# Patient Record
Sex: Male | Born: 1980 | Race: White | Hispanic: No | Smoking: Current some day smoker
Health system: Southern US, Community
[De-identification: ages and names within clinical notes are randomized; demographics above are authoritative.]

## PROBLEM LIST (undated history)

## (undated) DIAGNOSIS — M1990 Unspecified osteoarthritis, unspecified site: Secondary | ICD-10-CM

## (undated) DIAGNOSIS — R202 Paresthesia of skin: Secondary | ICD-10-CM

## (undated) DIAGNOSIS — R51 Headache: Secondary | ICD-10-CM

## (undated) DIAGNOSIS — R519 Headache, unspecified: Secondary | ICD-10-CM

---

## 2000-10-05 ENCOUNTER — Emergency Department (HOSPITAL_COMMUNITY): Admission: EM | Admit: 2000-10-05 | Discharge: 2000-10-05 | Payer: Self-pay | Admitting: Emergency Medicine

## 2011-09-23 ENCOUNTER — Encounter: Payer: Self-pay | Admitting: Internal Medicine

## 2011-09-23 ENCOUNTER — Ambulatory Visit (INDEPENDENT_AMBULATORY_CARE_PROVIDER_SITE_OTHER): Payer: Self-pay | Admitting: Internal Medicine

## 2011-09-23 DIAGNOSIS — F411 Generalized anxiety disorder: Secondary | ICD-10-CM

## 2011-09-23 DIAGNOSIS — F17200 Nicotine dependence, unspecified, uncomplicated: Secondary | ICD-10-CM

## 2011-09-23 DIAGNOSIS — N529 Male erectile dysfunction, unspecified: Secondary | ICD-10-CM

## 2011-09-23 DIAGNOSIS — F419 Anxiety disorder, unspecified: Secondary | ICD-10-CM

## 2011-09-23 MED ORDER — VARDENAFIL HCL 10 MG PO TABS: ORAL_TABLET | ORAL | Status: DC

## 2011-09-23 NOTE — Assessment & Plan Note (Signed)
Counseled regarding the need for cessation. Continue weaning attempts with ultimate cessation.

## 2011-09-23 NOTE — Assessment & Plan Note (Signed)
Obtain tsh (also interested at future lab visit to obtain basic screening labs such as chem7 and lipid). Avoid potentially habit forming medications. Discussed possible low dose ssri but currently defers.

## 2011-09-23 NOTE — Progress Notes (Signed)
  Subjective:    Patient ID: Scott Christian, male    DOB: 08-09-1981, 30 y.o.   MRN: [id number]  HPI Pt presents to clinic to establish care and for evaluation of multiple medical problems. Recent dental work (pulled teeth) with more planned and is taking pcn and vicodin currently. Notes several month h/o ED described as difficulty sustaining an erection. Has intact libido. From discussion there may be a performance anxiety component. Does has increased stress and anxiety without depressive sx's. Also notes decreased concentration without known h/o ADD. Wishes to avoid addictive medications. Currently smoking less than one ppd of cigarettes and is attempting to wean and cease. Declines flu vaccine.   Reviewed pmh, psh, medications, allergies, soc hx and fam hx.   Review of Systems  Psychiatric/Behavioral: Positive for decreased concentration. Negative for dysphoric mood and agitation. The patient is nervous/anxious.   All other systems reviewed and are negative.       Objective:   Physical Exam  Nursing note and vitals reviewed. Constitutional: He appears well-developed and well-nourished. No distress.  HENT:  Head: Normocephalic and atraumatic.  Right Ear: External ear normal.  Left Ear: External ear normal.  Eyes: Conjunctivae are normal. Right eye exhibits no discharge. Left eye exhibits no discharge. No scleral icterus.  Neck: Neck supple.  Cardiovascular: Normal rate, regular rhythm and normal heart sounds.  Exam reveals no gallop and no friction rub.   No murmur heard. Pulmonary/Chest: Effort normal and breath sounds normal. No respiratory distress. He has no wheezes. He has no rales.  Neurological: He is alert.  Skin: Skin is warm and dry. He is not diaphoretic. No erythema.  Psychiatric: He has a normal mood and affect. His speech is normal and behavior is normal. Thought content normal.          Assessment & Plan:

## 2011-09-23 NOTE — Assessment & Plan Note (Signed)
Obtain testosterone level (will call back regarding when available to perform labs). Attempt short term levitra prn. Suspect possible non organic component

## 2011-10-18 ENCOUNTER — Emergency Department (HOSPITAL_COMMUNITY)
Admission: EM | Admit: 2011-10-18 | Discharge: 2011-10-18 | Disposition: A | Discharge status: Home / Self Care | Payer: Self-pay | Attending: Emergency Medicine | Admitting: Emergency Medicine

## 2011-10-18 ENCOUNTER — Encounter (HOSPITAL_COMMUNITY): Payer: Self-pay | Admitting: *Deleted

## 2011-10-18 DIAGNOSIS — X500XXA Overexertion from strenuous movement or load, initial encounter: Secondary | ICD-10-CM | POA: Insufficient documentation

## 2011-10-18 DIAGNOSIS — M25519 Pain in unspecified shoulder: Secondary | ICD-10-CM | POA: Insufficient documentation

## 2011-10-18 DIAGNOSIS — F17200 Nicotine dependence, unspecified, uncomplicated: Secondary | ICD-10-CM | POA: Insufficient documentation

## 2011-10-18 DIAGNOSIS — IMO0001 Reserved for inherently not codable concepts without codable children: Secondary | ICD-10-CM | POA: Insufficient documentation

## 2011-10-18 DIAGNOSIS — S46919A Strain of unspecified muscle, fascia and tendon at shoulder and upper arm level, unspecified arm, initial encounter: Secondary | ICD-10-CM

## 2011-10-18 MED ORDER — DICLOFENAC SODIUM 75 MG PO TBEC: 75.0000 mg | DELAYED_RELEASE_TABLET | Freq: Two times a day (BID) | ORAL | Status: DC

## 2011-10-18 MED ORDER — CYCLOBENZAPRINE HCL 10 MG PO TABS: 10.0000 mg | ORAL_TABLET | Freq: Three times a day (TID) | ORAL | Status: AC | PRN

## 2011-10-18 MED ORDER — TRAMADOL HCL 50 MG PO TABS: 50.0000 mg | ORAL_TABLET | Freq: Four times a day (QID) | ORAL | Status: AC | PRN

## 2011-10-18 MED ORDER — KETOROLAC TROMETHAMINE 60 MG/2ML IM SOLN
60.0000 mg | Freq: Once | INTRAMUSCULAR | Status: AC
  Administered 2011-10-18: 60 mg via INTRAMUSCULAR
  Filled 2011-10-18: qty 2

## 2011-10-18 NOTE — ED Notes (Signed)
C/o left shoulder pain x 1 week, one week ago pt was swinging a heavy tool to dig holes in ground, c/o burning pain

## 2011-10-18 NOTE — ED Notes (Signed)
Pt a/ox4. Resp even and unlabored. NAD at this time. NAD at this time. D/C instructions reviewed with pt. Pt verbalized understanding. Pt ambulated to lobby with steady gate.

## 2011-10-18 NOTE — ED Provider Notes (Signed)
History     CSN: [id number] Arrival date & time: 10/18/2011  8:45 PM   First MD Initiated Contact with Patient 10/18/11 2056      Chief Complaint  Patient presents with  . Shoulder Pain    (Consider location/radiation/quality/duration/timing/severity/associated sxs/prior treatment) HPI Comments: Patient c/o pain left shoulder pain for greater than one week.  Pain began after he was "swinging a maddox" all day to dig holes at work.  Pain is worse with movement, improves slightly with rest.  He denies neck pain or stiffness, numbness or weakness  Patient is a 30 y.o. male presenting with shoulder pain. The history is provided by the patient.  Shoulder Pain The current episode started in the past 7 days. The problem occurs constantly. The problem has been unchanged. Associated symptoms include arthralgias and myalgias. Pertinent negatives include no abdominal pain, chills, fatigue, fever, headaches, joint swelling, neck pain, numbness, rash, sore throat or weakness. He has tried NSAIDs, heat and ice for the symptoms. The treatment provided no relief.    Past Medical History  Diagnosis Date  . History of chicken pox     Past Surgical History  Procedure Date  . No past surgeries 09/23/2011    denies surgical history    Family History  Problem Relation Age of Onset  . Uterine cancer Mother   . Diabetes Neg Hx   . Breast cancer Maternal Grandmother   . Hyperlipidemia Father   . Hyperlipidemia Paternal Grandfather   . Stroke Paternal Grandfather   . Hypertension Father   . Hypertension Paternal Grandfather     History  Substance Use Topics  . Smoking status: Current Everyday Smoker -- 0.5 packs/day    Types: Cigarettes  . Smokeless tobacco: Never Used   Comment: 1/2 ppd  . Alcohol Use: No      Review of Systems  Constitutional: Negative for fever, chills and fatigue.  HENT: Negative for sore throat, trouble swallowing, neck pain and neck stiffness.     Gastrointestinal: Negative for abdominal pain.  Genitourinary: Negative for dysuria, hematuria and flank pain.  Musculoskeletal: Positive for myalgias and arthralgias. Negative for back pain, joint swelling and gait problem.  Skin: Negative.  Negative for rash.  Neurological: Negative for dizziness, weakness, numbness and headaches.  Hematological: Does not bruise/bleed easily.    Allergies  Nickel  Home Medications   Current Outpatient Rx  Name Route Sig Dispense Refill  . HYDROCODONE-ACETAMINOPHEN 7.5-500 MG PO TABS Oral Take 1 tablet by mouth every 6 (six) hours as needed.      Marland Kitchen PENICILLIN V POTASSIUM 250 MG PO TABS Oral Take 500 mg by mouth every 4 (four) hours.      Marland Kitchen VARDENAFIL HCL 10 MG PO TABS  One by mouth 30-45 mins before intercourse daily as needed 6 tablet 1    BP 143/83  Pulse 66  Temp(Src) 97.5 F (36.4 C) (Oral)  Resp 20  Ht 6' (1.829 m)  Wt 167 lb (75.751 kg)  BMI 22.65 kg/m2  SpO2 100%  Physical Exam  Nursing note and vitals reviewed. Constitutional: He is oriented to person, place, and time. He appears well-developed and well-nourished. No distress.  HENT:  Head: Normocephalic and atraumatic.  Mouth/Throat: Oropharynx is clear and moist.  Neck: Normal range of motion. Neck supple.  Cardiovascular: Normal rate, regular rhythm and normal heart sounds.   Pulmonary/Chest: Effort normal and breath sounds normal. No respiratory distress. He exhibits no tenderness.  Musculoskeletal: Normal range of motion. He exhibits  tenderness. He exhibits no edema.       Left shoulder: He exhibits tenderness and pain. He exhibits normal range of motion, no bony tenderness, no swelling, no effusion, no crepitus, normal pulse and normal strength.       Arms: Lymphadenopathy:    He has no cervical adenopathy.  Neurological: He is alert and oriented to person, place, and time. No cranial nerve deficit or sensory deficit. He exhibits normal muscle tone. Coordination normal.   Reflex Scores:      Tricep reflexes are 2+ on the right side and 2+ on the left side.      Bicep reflexes are 2+ on the right side and 2+ on the left side. Skin: Skin is warm and dry.    ED Course  Procedures (including critical care time)       MDM    9:14 PM ttp of the left anterior shoulder and along the border of the left scapula.  Pt has full ROM of the shoulder joint, distal sensation intact, CR< 2 sec, radial pulse is brisk.  Likely muscular strain   Pt agrees to f/u with ortho if needed       Corran Lalone L. [place], [place] 10/18/11 2128

## 2011-10-18 NOTE — ED Provider Notes (Signed)
Medical screening examination/treatment/procedure(s) were performed by non-physician practitioner and as supervising physician I was immediately available for consultation/collaboration.   Juliet Rude. Rubin Payor, MD 10/18/11 2141

## 2011-11-04 ENCOUNTER — Ambulatory Visit: Payer: Self-pay | Admitting: Internal Medicine

## 2012-06-22 ENCOUNTER — Ambulatory Visit (INDEPENDENT_AMBULATORY_CARE_PROVIDER_SITE_OTHER): Payer: BC Managed Care – PPO | Admitting: Internal Medicine

## 2012-06-22 VITALS — BP 122/66 | HR 60 | Temp 98.0°F | Resp 16 | Ht 71.0 in | Wt 156.8 lb

## 2012-06-22 DIAGNOSIS — H5710 Ocular pain, unspecified eye: Secondary | ICD-10-CM

## 2012-06-22 DIAGNOSIS — H5712 Ocular pain, left eye: Secondary | ICD-10-CM

## 2012-06-22 DIAGNOSIS — H16009 Unspecified corneal ulcer, unspecified eye: Secondary | ICD-10-CM

## 2012-06-22 DIAGNOSIS — H16002 Unspecified corneal ulcer, left eye: Secondary | ICD-10-CM

## 2012-06-22 MED ORDER — DICLOFENAC SODIUM 75 MG PO TBEC: 75.0000 mg | DELAYED_RELEASE_TABLET | Freq: Two times a day (BID) | ORAL | Status: DC

## 2012-06-22 MED ORDER — HYDROCODONE-ACETAMINOPHEN 7.5-500 MG PO TABS: 1.0000 | ORAL_TABLET | Freq: Four times a day (QID) | ORAL | Status: DC | PRN

## 2012-06-22 MED ORDER — MOXIFLOXACIN HCL 0.5 % OP SOLN: 1.0000 [drp] | Freq: Four times a day (QID) | OPHTHALMIC | Status: AC

## 2012-06-22 NOTE — Progress Notes (Signed)
  Subjective:    Patient ID: Scott Christian, male    DOB: 26-May-1981, 31 y.o.   MRN: [id number]  HPI 31 yr old CM presents to clinic with L eye pain.  Initially started feeling like he might have something in his eye 4 days ago after work.  He works in lots of different kinds of metal/dust/etc.  He does not remember an exact moment of feeling like something got in his eye. He has continued to wear his contacts on and off.  He continues to have eye pain and the sensation that maybe he has something in his L eye.  +photophobia. No crusting this am and no drainage.  Review of Systems  All other systems reviewed and are negative.       Objective:   Physical Exam  Constitutional: He is oriented to person, place, and time. He appears well-developed and well-nourished.  Eyes: EOM and lids are normal. Pupils are equal, round, and reactive to light. No foreign bodies found. Right eye exhibits no discharge and no exudate. No foreign body present in the right eye. Left eye exhibits no discharge and no exudate. No foreign body present in the left eye. Right conjunctiva is not injected. Right conjunctiva has no hemorrhage. Left conjunctiva is injected (mild erythema of bulbar and palpebral conjunctiva). Left conjunctiva has no hemorrhage. No scleral icterus. Right eye exhibits no nystagmus. Left eye exhibits no nystagmus. Pupils are equal.    Pulmonary/Chest: Effort normal.  Neurological: He is alert and oriented to person, place, and time.  Psychiatric: He has a normal mood and affect. His behavior is normal.   opthaine + fluorescein reveals increased uptake at ulceration as described above.       Assessment & Plan:  Corneal ulcer-seen and examined by myself and Dr. Perrin Maltese.  Ofloxacin drops were placed in office(didn't have vigamox here) and patient is to pick up prescription tonight.  To Dr. Laruth Bouchard office tomorrow. DO NOT WEAR CONTACTS!!

## 2012-08-30 ENCOUNTER — Emergency Department (HOSPITAL_COMMUNITY): Payer: BC Managed Care – PPO

## 2012-08-30 ENCOUNTER — Observation Stay (HOSPITAL_COMMUNITY)
Admission: EM | Admit: 2012-08-30 | Discharge: 2012-08-31 | DRG: 130 | Disposition: A | Discharge status: Home / Self Care | Payer: BC Managed Care – PPO | Attending: Internal Medicine | Admitting: Internal Medicine

## 2012-08-30 ENCOUNTER — Encounter (HOSPITAL_COMMUNITY): Payer: Self-pay | Admitting: Emergency Medicine

## 2012-08-30 DIAGNOSIS — M79609 Pain in unspecified limb: Secondary | ICD-10-CM

## 2012-08-30 DIAGNOSIS — F1110 Opioid abuse, uncomplicated: Secondary | ICD-10-CM | POA: Insufficient documentation

## 2012-08-30 DIAGNOSIS — F1910 Other psychoactive substance abuse, uncomplicated: Secondary | ICD-10-CM

## 2012-08-30 DIAGNOSIS — I999 Unspecified disorder of circulatory system: Secondary | ICD-10-CM

## 2012-08-30 DIAGNOSIS — I998 Other disorder of circulatory system: Secondary | ICD-10-CM | POA: Diagnosis present

## 2012-08-30 DIAGNOSIS — I742 Embolism and thrombosis of arteries of the upper extremities: Secondary | ICD-10-CM

## 2012-08-30 DIAGNOSIS — M7989 Other specified soft tissue disorders: Secondary | ICD-10-CM | POA: Insufficient documentation

## 2012-08-30 DIAGNOSIS — N529 Male erectile dysfunction, unspecified: Secondary | ICD-10-CM

## 2012-08-30 DIAGNOSIS — F419 Anxiety disorder, unspecified: Secondary | ICD-10-CM | POA: Diagnosis present

## 2012-08-30 DIAGNOSIS — M79644 Pain in right finger(s): Secondary | ICD-10-CM

## 2012-08-30 DIAGNOSIS — F17200 Nicotine dependence, unspecified, uncomplicated: Secondary | ICD-10-CM

## 2012-08-30 DIAGNOSIS — R209 Unspecified disturbances of skin sensation: Secondary | ICD-10-CM | POA: Insufficient documentation

## 2012-08-30 LAB — CBC WITH DIFFERENTIAL/PLATELET
Basophils Relative: 1 % (ref 0–1)
Eosinophils Absolute: 0.5 10*3/uL (ref 0.0–0.7)
MCH: 33 pg (ref 26.0–34.0)
MCHC: 35.8 g/dL (ref 30.0–36.0)
Monocytes Relative: 9 % (ref 3–12)
Neutrophils Relative %: 50 % (ref 43–77)
Platelets: 260 10*3/uL (ref 150–400)

## 2012-08-30 LAB — CBC
MCH: 32.5 pg (ref 26.0–34.0)
MCV: 91.9 fL (ref 78.0–100.0)
Platelets: 247 10*3/uL (ref 150–400)
RDW: 12.3 % (ref 11.5–15.5)
WBC: 7.5 10*3/uL (ref 4.0–10.5)

## 2012-08-30 LAB — BASIC METABOLIC PANEL
BUN: 10 mg/dL (ref 6–23)
Calcium: 9.6 mg/dL (ref 8.4–10.5)
GFR calc Af Amer: >90 mL/min (ref >90)
GFR calc non Af Amer: >90 mL/min (ref >90)
Potassium: 5.2 mEq/L — ABNORMAL HIGH (ref 3.5–5.1)
Sodium: 136 mEq/L (ref 135–145)

## 2012-08-30 LAB — HEPATIC FUNCTION PANEL
ALT: 15 U/L (ref 0–53)
AST: 22 U/L (ref 0–37)
Albumin: 4.3 g/dL (ref 3.5–5.2)
Total Bilirubin: 0.3 mg/dL (ref 0.3–1.2)

## 2012-08-30 LAB — PROTIME-INR: Prothrombin Time: 14.1 seconds (ref 11.6–15.2)

## 2012-08-30 LAB — HEMOGLOBIN A1C: Mean Plasma Glucose: 103 mg/dL (ref <117)

## 2012-08-30 MED ORDER — NICOTINE 14 MG/24HR TD PT24
14.0000 mg | MEDICATED_PATCH | Freq: Every day | TRANSDERMAL | Status: DC
  Administered 2012-08-30 – 2012-08-31 (×2): 14 mg via TRANSDERMAL
  Filled 2012-08-30 (×2): qty 1

## 2012-08-30 MED ORDER — VANCOMYCIN HCL IN DEXTROSE 1-5 GM/200ML-% IV SOLN
1000.0000 mg | Freq: Once | INTRAVENOUS | Status: AC
  Administered 2012-08-30: 1000 mg via INTRAVENOUS
  Filled 2012-08-30: qty 200

## 2012-08-30 MED ORDER — SODIUM CHLORIDE 0.9 % IJ SOLN: 3.0000 mL | Freq: Two times a day (BID) | INTRAMUSCULAR | Status: DC

## 2012-08-30 MED ORDER — HEPARIN (PORCINE) IN NACL 100-0.45 UNIT/ML-% IJ SOLN
850.0000 [IU]/h | INTRAMUSCULAR | Status: DC
  Administered 2012-08-31: 850 [IU]/h via INTRAVENOUS
  Filled 2012-08-30: qty 250

## 2012-08-30 MED ORDER — ONDANSETRON HCL 4 MG PO TABS: 4.0000 mg | ORAL_TABLET | Freq: Four times a day (QID) | ORAL | Status: DC | PRN

## 2012-08-30 MED ORDER — ONDANSETRON HCL 4 MG/2ML IJ SOLN: 4.0000 mg | Freq: Four times a day (QID) | INTRAMUSCULAR | Status: DC | PRN

## 2012-08-30 MED ORDER — SODIUM CHLORIDE 0.9 % IV SOLN
INTRAVENOUS | Status: DC
  Administered 2012-08-30 – 2012-08-31 (×2): via INTRAVENOUS

## 2012-08-30 MED ORDER — ACETAMINOPHEN 325 MG PO TABS: 650.0000 mg | ORAL_TABLET | Freq: Four times a day (QID) | ORAL | Status: DC | PRN

## 2012-08-30 MED ORDER — CARISOPRODOL 250 MG PO TABS: 250.0000 mg | ORAL_TABLET | Freq: Three times a day (TID) | ORAL | Status: DC | PRN

## 2012-08-30 MED ORDER — VANCOMYCIN HCL IN DEXTROSE 1-5 GM/200ML-% IV SOLN
1000.0000 mg | Freq: Three times a day (TID) | INTRAVENOUS | Status: DC
  Administered 2012-08-31 (×2): 1000 mg via INTRAVENOUS
  Filled 2012-08-30 (×3): qty 200

## 2012-08-30 MED ORDER — ACETAMINOPHEN 650 MG RE SUPP: 650.0000 mg | Freq: Four times a day (QID) | RECTAL | Status: DC | PRN

## 2012-08-30 MED ORDER — CARISOPRODOL 350 MG PO TABS: 350.0000 mg | ORAL_TABLET | Freq: Three times a day (TID) | ORAL | Status: DC | PRN

## 2012-08-30 MED ORDER — ENOXAPARIN SODIUM 80 MG/0.8ML ~~LOC~~ SOLN
70.0000 mg | Freq: Once | SUBCUTANEOUS | Status: AC
  Administered 2012-08-30: 70 mg via SUBCUTANEOUS
  Filled 2012-08-30: qty 0.8

## 2012-08-30 NOTE — ED Notes (Signed)
Rt thumb pain x 1 week [facility] and was told to f/u  If no better   Sore and swollen

## 2012-08-30 NOTE — Consult Note (Addendum)
ANTIBIOTIC CONSULT NOTE - INITIAL  Pharmacy Consult for Vancomycin, Lovenox Indication: cellulitis, RUE SVT  Allergies  Allergen Reactions  . Nickel Rash    Patient Measurements: Height: 5' 10.87" (180 cm) Weight: 156 lb 12 oz (71.1 kg) IBW/kg (Calculated) : 74.99   Vital Signs: Temp: 99 F (37.2 C) (10/07 1219) BP: 139/69 mmHg (10/07 1219) Pulse Rate: 85  (10/07 1219) Intake/Output from previous day:   Intake/Output from this shift:    Labs:  Basename 08/30/12 1405  WBC 7.3  HGB 15.0  PLT 260  LABCREA --  CREATININE 0.72   Estimated Creatinine Clearance: 132.1 ml/min (by C-G formula based on Cr of 0.72).  Microbiology: No results found for this or any previous visit (from the past 720 hour(s)).  Medical History: Past Medical History  Diagnosis Date  . History of chicken pox    Assessment: 31yom presents to ED with right thumb pain. Was seen at [facility] and given Bactrim but thumb is not getting any better. He will now begin empiric vancomycin for cellulitis. Renal function is wnl.  Also to begin full dose lovenox for RUE superficial thrombus in the cephalic and basilic veins. No DVT noted.   Goal of Therapy:  Vancomycin trough 10-15 Anti-Xa 0.6-1.2  Plan:  1) Vancomycin 1g IV q8 2) Follow renal function, cultures, levels as indicated 3) Lovenox 70mg  sq q12 4) CBC q72h while on lovenox  Fredrik Rigger 08/30/2012,4:40 PM

## 2012-08-30 NOTE — Progress Notes (Signed)
Duplex imaging of the right upper extremity demonstrates superficial thrombus in the cephalic and basilic veins.  No evidence of DVT.  No evidence of significant plaque or stenosis.

## 2012-08-30 NOTE — H&P (Addendum)
[facility] History and Physical  Scott Christian WUJ:[id number] DOB: November 04, 1981 DOA: 08/30/2012  Referring physician: Juliet Rude. Rubin Payor, MD  PCP: Letitia Libra, Ala Dach, MD   Chief Complaint: Right thumb pain HPI:  31 year old male presents to the ER complaining of constant, moderate right thumb pain onset one week ago. The patient admits to IV drug use 3 months and then 3 weeks ago. He states that he has injected almost everywhere, he has been using IV Suboxone. He denies any fever chills rigors weight loss, any chest pain shortness of breath or any pain in his right upper extremity. He denies any prior history of DVT or PE. The pain in his thumb has worsened and the tip has turned white with some duskiness. No prior episodes. 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 States last use was 3 weeks ago. Has used all extremities in the past for injection. Thrombosed cephalic vein noted on ultrasound earlier today by report. Other medical problems include none.      Review of Systems: negative for the following   Constitutional: Chills, Fever, Unexplained weight loss, Unexplained recent fatigue, Change in appetite  Eyes: Blurred vision, redness in eyes, pain in eyes  Ears, Nose, Mouth, Throat: Unexplained decreased hearing, sore throat swollen glands  Cardiovascular: Chest pain, irregular heartbeat, frequent dizziness upon standing up, shortness of breath lying down  Respiratory: Cough, wheezing, shortness of breath   Gastrointestinal: Moderate to severe abdominal pain, diarrhea, blood in stool  Genitourinary: Blood in urine, difficulty urinating. Women only, change in menstrual period, irregular menstrual periods, excessively heavy bleeding   Musculoskeletal: Arthritis, abnormal weakness of muscles   Breasts: New breast lumps, nipple discharge or irritation   Neurological:  Dizziness, numbing/tingling, loss of balance when walking   Psychiatric: Depression, mood swings, significant and unexplained memory loss  Endocrine: Excessively hot or cold, unexplained weight loss or gain   Hematologic/Lymphatic: Swollen glands, easy bruising   Allergic/Immunologic: Hives, runny nose, sneezing      Past Medical History  Diagnosis Date  . History of chicken pox      Past Surgical History  Procedure Date  . No past surgeries 09/23/2011    denies surgical history     Social History  History   Substance Use Topics   .  Smoking status:  Current Every Day Smoker -- 0.5 packs/day     Types:  Cigarettes   .  Smokeless tobacco:  Never Used     Comment: 1/2 ppd    .  Alcohol Use:  No    Family History  Family History   Problem  Relation  Age of Onset   .  Uterine cancer  Mother    .  Diabetes  Neg Hx    .  Breast cancer  Maternal Grandmother    .  Hyperlipidemia  Father    .  Hyperlipidemia  Paternal Grandfather    .  Stroke  Paternal Grandfather    .  Hypertension  Father    .  Hypertension  Paternal Grandfather         Allergies  Allergen Reactions  . Nickel Rash    Family History  Problem Relation Age of Onset  . Uterine cancer Mother   . Diabetes Neg Hx   . Breast cancer Maternal Grandmother   . Hyperlipidemia Father   . Hyperlipidemia Paternal Grandfather   .  Stroke Paternal Grandfather   . Hypertension Father   . Hypertension Paternal Grandfather      Prior to Admission medications   Medication Sig Start Date End Date Taking? Authorizing Provider  carisoprodol (SOMA) 250 MG tablet Take 250 mg by mouth 3 (three) times daily as needed. For pain   Yes Historical Provider, MD  sulfamethoxazole-trimethoprim (BACTRIM DS) 800-160 MG per tablet Take 1 tablet by mouth 2 (two) times daily. Started on 08/27/12. For 10 days   Yes Historical Provider, MD     Physical Exam: Filed Vitals:   08/30/12 1219 08/30/12 1500 08/30/12 1804  BP:  139/69  128/83  Pulse: 85  71  Temp: 99 F (37.2 C)  98.3 F (36.8 C)  TempSrc:   Oral  Resp: 16  20  Height:  5' 10.87" (1.8 m)   Weight:  71.1 kg (156 lb 12 oz)   SpO2: 99%  99%    General appearance: NAD, conversant  Eyes: anicteric sclerae, moist conjunctivae; no lid-lag; PERRLA HENT: Atraumatic; oropharynx clear with moist mucous membranes and no mucosal ulcerations; normal hard and soft palate Neck: Trachea midline; FROM, supple, no thyromegaly or lymphadenopathy Lungs: CTA, with normal respiratory effort and no intercostal retractions CV: RRR, no MRGs  Abdomen: Soft, non-tender; no masses or HSM Extremities: No peripheral edema or extremity lymphadenopathy Skin: Normal temperature, turgor and texture; no rash, ulcers or subcutaneous nodules Psych: Appropriate affect, alert and oriented to person, place and time Musculoskeletal: Normal range of motion.  Purplish coloration of right thumb from mid proximal phalanx out Blanching at tip Good radial pulse Cap refill nl       Labs on Admission:    Basic Metabolic Panel:  Lab 08/30/12 1610  NA 136  K 5.2*  CL 102  CO2 23  GLUCOSE 88  BUN 10  CREATININE 0.72  CALCIUM 9.6  MG --  PHOS --   Liver Function Tests: No results found for this basename: AST:5,ALT:5,ALKPHOS:5,BILITOT:5,PROT:5,ALBUMIN:5 in the last 168 hours No results found for this basename: LIPASE:5,AMYLASE:5 in the last 168 hours No results found for this basename: AMMONIA:5 in the last 168 hours CBC:  Lab 08/30/12 1405  WBC 7.3  NEUTROABS 3.6  HGB 15.0  HCT 41.9  MCV 92.1  PLT 260   Cardiac Enzymes: No results found for this basename: CKTOTAL:5,CKMB:5,CKMBINDEX:5,TROPONINI:5 in the last 168 hours  BNP (last 3 results) No results found for this basename: PROBNP:3 in the last 8760 hours    CBG: No results found for this basename: GLUCAP:5 in the last 168 hours  Radiological Exams on Admission: Dg Finger Thumb Right  08/30/2012   *RADIOLOGY REPORT*  Clinical Data: Right thumb pain, no injury  RIGHT THUMB 2+V  Comparison: None.  Findings: No fracture or dislocation is seen.  The joint spaces are preserved.  The visualized soft tissues are unremarkable.  IMPRESSION: No acute osseous abnormality is seen.   Original Report Authenticated By: Charline Bills, M.D.     EKG: Independently reviewed.  Assessment/Plan Active Problems:  Mild anxiety  Limb ischemia   #1 Ischemia right first digit with intact radial brachial ulnar Possible arterial thrombosis, the patient also has superficial DVT in the radial and ulnar veins He has been started on full dose anticoagulation Vascular surgery has been consulted Patient to be started on a heparin drip Upper extremity arteriogram tomorrow, n.p.o. past midnight, Full vasculitis workup has been ordered Hepatitis panel HIV has been ordered   #2 polysubstance abuse/nicotine dependence Nicotine patch  will be provided Social work consultation needs to be obtained   Code Status:   full Family Communication: bedside Disposition Plan: admit   Time spent: 70 mins   [facility] Pager [phone]  If 7PM-7AM, please contact night-coverage [email or web address] Password [email or web address] 08/30/2012, 7:02 PM

## 2012-08-30 NOTE — ED Notes (Signed)
Got antiobiotics and has been taking them but is no better

## 2012-08-30 NOTE — ED Notes (Signed)
Vascular surgeon at bedside.

## 2012-08-30 NOTE — Progress Notes (Signed)
ANTICOAGULATION CONSULT NOTE - Follow Up Consult  Pharmacy Consult for Heparin Indication: RUE SVT, possible arterial thrombosis  Allergies  Allergen Reactions  . Nickel Rash    Patient Measurements: Height: 5' 10.87" (180 cm) Weight: 156 lb 12 oz (71.1 kg) IBW/kg (Calculated) : 74.99  Heparin Dosing Weight: 71kg  Vital Signs: Temp: 98.3 F (36.8 C) (10/07 1804) Temp src: Oral (10/07 1804) BP: 128/83 mmHg (10/07 1804) Pulse Rate: 71  (10/07 1804)  Labs:  Basename 08/30/12 1405  HGB 15.0  HCT 41.9  PLT 260  APTT --  LABPROT --  INR --  HEPARINUNFRC --  CREATININE 0.72  CKTOTAL --  CKMB --  TROPONINI --    Estimated Creatinine Clearance: 132.1 ml/min (by C-G formula based on Cr of 0.72).  Assessment: 31yom to switch from lovenox to heparin for RUE SVT/possible arterial thrombosis awaiting arteriogram tomorrow. He received 70mg  of lovenox in the ED at 1752. He should be covered for about 12 hours so will not start heparin until tomorrow morning.  Baseline CBC wnl.  Goal of Therapy:  Heparin level 0.3-0.7 units/ml Monitor platelets by anticoagulation protocol: Yes   Plan:  1) At 0500 on 10/8, start heparin at 850 units/hr (no bolus) 2) 6h heparin level after start of infusion 3) Daily heparin level and CBC   Fredrik Rigger 08/30/2012,7:11 PM

## 2012-08-30 NOTE — ED Notes (Signed)
Report given to ginger

## 2012-08-30 NOTE — Consult Note (Addendum)
[facility] [place] Consult Note   Requesting: [facility] Reason for consult: right hand ischemia History of Present Illness:  Patient is a 31 y.o. year old male who presents for evaluation of numbness and tingling tip of right first digit. The patient began having some pain in the right thumb several days ago and was seen in the ER in [place] and placed on antibiotics.  The pain in his thumb has worsened and the tip has turned white with some duskiness.  No prior episodes.  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  States last use was 3 weeks ago. Has used all extremities in the past for injection.  Thrombosed cephalic vein noted on ultrasound earlier today by report.  Other medical problems include none.  Past Medical History  Diagnosis Date  . History of chicken pox     Past Surgical History  Procedure Date  . No past surgeries 09/23/2011    denies surgical history     Social History History  Substance Use Topics  . Smoking status: Current Every Day Smoker -- 0.5 packs/day    Types: Cigarettes  . Smokeless tobacco: Never Used   Comment: 1/2 ppd  . Alcohol Use: No    Family History Family History  Problem Relation Age of Onset  . Uterine cancer Mother   . Diabetes Neg Hx   . Breast cancer Maternal Grandmother   . Hyperlipidemia Father   . Hyperlipidemia Paternal Grandfather   . Stroke Paternal Grandfather   . Hypertension Father   . Hypertension Paternal Grandfather     Allergies  Allergies  Allergen Reactions  . Nickel Rash     Current Facility-Administered Medications  Medication Dose Route Frequency Provider Last Rate Last Dose  . enoxaparin (LOVENOX) injection 70 mg  70 mg Subcutaneous Once Hessie Diener [place], PHARMD   70 mg at 08/30/12 1752  . vancomycin (VANCOCIN) IVPB 1000 mg/200 mL premix  1,000 mg Intravenous Once  Fredrik Rigger, PHARMD   1,000 mg at 08/30/12 1752   Current Outpatient Prescriptions  Medication Sig Dispense Refill  . carisoprodol (SOMA) 250 MG tablet Take 250 mg by mouth 3 (three) times daily as needed. For pain      . sulfamethoxazole-trimethoprim (BACTRIM DS) 800-160 MG per tablet Take 1 tablet by mouth 2 (two) times daily. Started on 08/27/12. For 10 days        ROS:   General:  No weight loss, Fever, chills  HEENT: No recent headaches, no nasal bleeding, no visual changes, no sore throat  Neurologic: No dizziness, blackouts, seizures. No recent symptoms of stroke or mini- stroke. No recent episodes of slurred speech, or temporary blindness.  Cardiac: No recent episodes of chest pain/pressure, no shortness of breath at rest.  No shortness of breath with exertion.  Denies history of atrial fibrillation or irregular heartbeat  Vascular: No history of rest pain in feet.  No history of claudication.  No history of non-healing ulcer, No history of DVT   Pulmonary: No home oxygen, no productive cough, no hemoptysis,  No asthma or wheezing  Musculoskeletal:  [ ]  Arthritis, [ ]  Low back pain,  [ ]  Joint pain  Hematologic:No history of hypercoagulable state.  No history of easy bleeding.  No history of anemia  Gastrointestinal: No hematochezia or melena,  No gastroesophageal reflux, no trouble swallowing  Urinary: [ ]   chronic Kidney disease, [ ]  on HD - [ ]  MWF or [ ]  TTHS, [ ]  Burning with urination, [ ]  Frequent urination, [ ]  Difficulty urinating;   Skin: No rashes  Psychological: + history of anxiety,  No history of depression   Physical Examination  Filed Vitals:   08/30/12 1219 08/30/12 1500 08/30/12 1804  BP: 139/69  128/83  Pulse: 85  71  Temp: 99 F (37.2 C)  98.3 F (36.8 C)  TempSrc:   Oral  Resp: 16  20  Height:  5' 10.87" (1.8 m)   Weight:  156 lb 12 oz (71.1 kg)   SpO2: 99%  99%    Body mass index is 21.94 kg/(m^2).  General:  Alert and oriented,  no acute distress HEENT: Normal Neck: No bruit or JVD Pulmonary: Clear to auscultation bilaterally Cardiac: Regular Rate and Rhythm without murmur Abdomen: Soft, non-tender, non-distended, no mass, no scars Skin: No rash, right first digit cool with purpura at the distal phalanx and a pale digit tip, sensation intact except distal tip, all other digits normal, some purpura along the thenar eminence with mild tenderness, no mass, needle marks dorsal foot bilat, palpable cord over right cephalic vein no erythema Extremity Pulses:  2+ radial, brachial, femoral, absent dorsalis pedis, 2+ posterior tibial pulses bilaterally Musculoskeletal: No deformity or edema  Neurologic: Upper and lower extremity motor 5/5 and symmetric  DATA: Hand xray no fracture   ASSESSMENT: Ischemia right first digit with intact radial brachial ulnar.  Possible thrombosis (from IVDA or trauma) vs embolization (as above) vs Buerger's variant less likely vasculitis or cardiac origin   PLAN: Right upper extremity arteriogram tomorrow NPO post midnight Will d/c Lovenox in preference for heparin since is can be easily turned off and goes away quickly  Fabienne Bruns, MD [facility] [place] Office: [phone] Pager: [phone]

## 2012-08-30 NOTE — ED Provider Notes (Addendum)
History    This chart was scribed for [employer]. Rubin Payor, MD, MD by Smitty Pluck. The patient was seen in room TR09C and the patient's care was started at 1:02PM.   CSN: [id number]  Arrival date & time 08/30/12  1214   None     No chief complaint on file.   (Consider location/radiation/quality/duration/timing/severity/associated sxs/prior treatment) The history is provided by the patient. No language interpreter was used.   Scott Christian is a 31 y.o. male who presents to the Emergency Department complaining of constant, moderate right thumb pain onset 1 week ago. Pt reports that he went to Urgent Care in [place] and was told to follow up if symptoms did not improve after taking abx. He reports that symptoms started with numbness and that he was pulling a wire at work. He reports that the right thumb was cold to touch. He states that he has swelling. Pt reports using IV drugs in the past (3 years ago)  Past Medical History  Diagnosis Date  . History of chicken pox     Past Surgical History  Procedure Date  . No past surgeries 09/23/2011    denies surgical history    Family History  Problem Relation Age of Onset  . Uterine cancer Mother   . Diabetes Neg Hx   . Breast cancer Maternal Grandmother   . Hyperlipidemia Father   . Hyperlipidemia Paternal Grandfather   . Stroke Paternal Grandfather   . Hypertension Father   . Hypertension Paternal Grandfather     History  Substance Use Topics  . Smoking status: Current Every Day Smoker -- 0.5 packs/day    Types: Cigarettes  . Smokeless tobacco: Never Used   Comment: 1/2 ppd  . Alcohol Use: No      Review of Systems  Constitutional: Negative for fever and chills.  Respiratory: Positive for wheezing. Negative for shortness of breath.   Gastrointestinal: Negative for nausea and vomiting.  Skin: Positive for color change.  Neurological: Negative for weakness.    Allergies  Nickel  Home Medications   Current  Outpatient Rx  Name Route Sig Dispense Refill  . CARISOPRODOL 250 MG PO TABS Oral Take 250 mg by mouth 3 (three) times daily as needed. For pain    . SULFAMETHOXAZOLE-TMP DS 800-160 MG PO TABS Oral Take 1 tablet by mouth 2 (two) times daily. Started on 08/27/12. For 10 days      BP 139/69  Pulse 85  Temp 99 F (37.2 C)  Resp 16  SpO2 99%  Physical Exam  Nursing note and vitals reviewed. Constitutional: He is oriented to person, place, and time. He appears well-developed and well-nourished. No distress.  HENT:  Head: Normocephalic and atraumatic.  Eyes: EOM are normal.  Neck: Neck supple. No tracheal deviation present.  Cardiovascular: Normal rate, regular rhythm and normal heart sounds.   No murmur heard. Pulmonary/Chest: Effort normal. No respiratory distress. He has wheezes (mild).  Musculoskeletal: Normal range of motion.       Purplish coloration of right thumb from mid proximal phalanx out Blanching at tip Good radial pulse Cap refill nl   Neurological: He is alert and oriented to person, place, and time.  Skin: Skin is warm and dry.  Psychiatric: He has a normal mood and affect. His behavior is normal.    ED Course  Procedures (including critical care time)  COORDINATION OF CARE: 1:11 PM Discussed ED treatment with pt     Labs Reviewed  CBC WITH  DIFFERENTIAL - Abnormal; Notable for the following:    Eosinophils Relative 6 (*)     All other components within normal limits  BASIC METABOLIC PANEL - Abnormal; Notable for the following:    Potassium 5.2 (*)     All other components within normal limits  CULTURE, BLOOD (ROUTINE X 2)  CULTURE, BLOOD (ROUTINE X 2)   Dg Finger Thumb Right  08/30/2012  *RADIOLOGY REPORT*  Clinical Data: Right thumb pain, no injury  RIGHT THUMB 2+V  Comparison: None.  Findings: No fracture or dislocation is seen.  The joint spaces are preserved.  The visualized soft tissues are unremarkable.  IMPRESSION: No acute osseous abnormality is  seen.   Original Report Authenticated By: Charline Bills, M.D.      1. Pain of right thumb       MDM  Patient with acute onset of some pain week ago. States he was just getting ready for work when it started. He's been seen at urgent care and was given antibiotics. Patient has looked up on the computer and is worried about ischemia. There is delayed cap refill and he is a former IV drug user. No murmur but patient will need further evaluation here. No clear infection. There are a couple other areas that could be an embolic cause. Patient will be admitted to medicine. Thumb has waxed and waned and appears less verbal than it did earlier.    I personally performed the services described in this documentation, which was scribed in my presence. The recorded information has been reviewed and considered.    Juliet Rude. Rubin Payor, MD 08/30/12 1322  Juliet Rude. Rubin Payor, MD 08/30/12 1606

## 2012-08-31 ENCOUNTER — Encounter (HOSPITAL_COMMUNITY)
Admission: EM | Disposition: A | Discharge status: Home / Self Care | Payer: Self-pay | Source: Home / Self Care | Attending: Internal Medicine

## 2012-08-31 DIAGNOSIS — F1910 Other psychoactive substance abuse, uncomplicated: Secondary | ICD-10-CM

## 2012-08-31 DIAGNOSIS — I999 Unspecified disorder of circulatory system: Secondary | ICD-10-CM

## 2012-08-31 HISTORY — PX: BILATERAL UPPER EXTREMITY ANGIOGRAM: SHX5503

## 2012-08-31 LAB — COMPREHENSIVE METABOLIC PANEL
ALT: 15 U/L (ref 0–53)
AST: 21 U/L (ref 0–37)
Albumin: 3.7 g/dL (ref 3.5–5.2)
Alkaline Phosphatase: 75 U/L (ref 39–117)
BUN: 7 mg/dL (ref 6–23)
Chloride: 102 mEq/L (ref 96–112)
Potassium: 3.8 mEq/L (ref 3.5–5.1)
Sodium: 136 mEq/L (ref 135–145)
Total Protein: 7 g/dL (ref 6.0–8.3)

## 2012-08-31 LAB — CBC
HCT: 40.6 % (ref 39.0–52.0)
MCH: 32.3 pg (ref 26.0–34.0)
MCV: 92.3 fL (ref 78.0–100.0)
RDW: 12.4 % (ref 11.5–15.5)
WBC: 4.5 10*3/uL (ref 4.0–10.5)

## 2012-08-31 LAB — ANTI-NUCLEAR AB-TITER (ANA TITER)

## 2012-08-31 LAB — HEPATITIS PANEL, ACUTE
Hep A IgM: NEGATIVE
Hep B C IgM: NEGATIVE

## 2012-08-31 LAB — SEDIMENTATION RATE: Sed Rate: 5 mm/hr (ref 0–16)

## 2012-08-31 LAB — ANCA SCREEN W REFLEX TITER
c-ANCA Screen: NEGATIVE
p-ANCA Screen: NEGATIVE

## 2012-08-31 SURGERY — BILATERAL UPPER EXTREMITY ANGIOGRAM
Anesthesia: LOCAL

## 2012-08-31 MED ORDER — HEPARIN (PORCINE) IN NACL 2-0.9 UNIT/ML-% IJ SOLN
INTRAMUSCULAR | Status: AC
  Filled 2012-08-31: qty 500

## 2012-08-31 MED ORDER — FENTANYL CITRATE 0.05 MG/ML IJ SOLN
INTRAMUSCULAR | Status: AC
  Filled 2012-08-31: qty 2

## 2012-08-31 MED ORDER — SODIUM CHLORIDE 0.9 % IV SOLN: INTRAVENOUS | Status: DC

## 2012-08-31 MED ORDER — ACETAMINOPHEN 650 MG RE SUPP: 325.0000 mg | RECTAL | Status: DC | PRN

## 2012-08-31 MED ORDER — OXYCODONE-ACETAMINOPHEN 5-325 MG PO TABS
ORAL_TABLET | ORAL | Status: AC
  Filled 2012-08-31: qty 2

## 2012-08-31 MED ORDER — GUAIFENESIN-DM 100-10 MG/5ML PO SYRP
15.0000 mL | ORAL_SOLUTION | ORAL | Status: DC | PRN
  Filled 2012-08-31: qty 15

## 2012-08-31 MED ORDER — CLONIDINE HCL 0.2 MG PO TABS
0.2000 mg | ORAL_TABLET | ORAL | Status: DC | PRN
  Filled 2012-08-31: qty 1

## 2012-08-31 MED ORDER — MIDAZOLAM HCL 2 MG/2ML IJ SOLN
INTRAMUSCULAR | Status: AC
  Filled 2012-08-31: qty 2

## 2012-08-31 MED ORDER — ONDANSETRON HCL 4 MG/2ML IJ SOLN: 4.0000 mg | Freq: Four times a day (QID) | INTRAMUSCULAR | Status: DC | PRN

## 2012-08-31 MED ORDER — ACETAMINOPHEN 325 MG PO TABS
325.0000 mg | ORAL_TABLET | ORAL | Status: DC | PRN
  Administered 2012-08-31: 650 mg via ORAL
  Filled 2012-08-31: qty 2

## 2012-08-31 MED ORDER — NICOTINE 14 MG/24HR TD PT24: 1.0000 | MEDICATED_PATCH | Freq: Every day | TRANSDERMAL | Status: DC

## 2012-08-31 MED ORDER — PHENOL 1.4 % MT LIQD
1.0000 | OROMUCOSAL | Status: DC | PRN
  Filled 2012-08-31: qty 177

## 2012-08-31 MED ORDER — OXYCODONE-ACETAMINOPHEN 5-325 MG PO TABS
2.0000 | ORAL_TABLET | Freq: Once | ORAL | Status: AC
  Administered 2012-08-31: 2 via ORAL

## 2012-08-31 MED ORDER — PNEUMOCOCCAL VAC POLYVALENT 25 MCG/0.5ML IJ INJ: 0.5000 mL | INJECTION | INTRAMUSCULAR | Status: DC

## 2012-08-31 MED ORDER — LIDOCAINE HCL (PF) 1 % IJ SOLN
INTRAMUSCULAR | Status: AC
  Filled 2012-08-31: qty 30

## 2012-08-31 MED ORDER — ALUM & MAG HYDROXIDE-SIMETH 200-200-20 MG/5ML PO SUSP: 15.0000 mL | ORAL | Status: DC | PRN

## 2012-08-31 MED ORDER — METOPROLOL TARTRATE 1 MG/ML IV SOLN
2.0000 mg | INTRAVENOUS | Status: DC | PRN
  Filled 2012-08-31: qty 5

## 2012-08-31 NOTE — Progress Notes (Signed)
  Echocardiogram 2D Echocardiogram has been performed.  Scott Christian 08/31/2012, 2:48 PM

## 2012-08-31 NOTE — Progress Notes (Signed)
Clinical Social Work Department BRIEF PSYCHOSOCIAL ASSESSMENT 08/31/2012  Patient:  Scott Christian, Scott Christian     Account Number:  [id number]     Admit date:  08/30/2012  Clinical Social Worker:  Dennison Bulla  Date/Time:  08/31/2012 10:30 AM  Referred by:  Physician  Date Referred:  08/31/2012 Referred for  Psychosocial assessment   Other Referral:   Interview type:  Patient Other interview type:    PSYCHOSOCIAL DATA Living Status:  FAMILY Admitted from facility:   Level of care:   Primary support name:  Melissa Primary support relationship to patient:  FRIEND Degree of support available:   Strong    CURRENT CONCERNS Current Concerns  Substance Abuse   Other Concerns:    SOCIAL WORK ASSESSMENT / PLAN Patient was discussed during progression as having a history of using IV drugs. CSW attempted to meet with patient but he had several visitors. After some visitors left, girlfriend asked CSW to come back to room.    CSW introduced myself and explained role. Patient lives with girlfriend and son. Patient has another dtr and girlfriend is currently pregnant and due in November. 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 Patient agreeable to discuss restrictions with MD. Patient reports that he has insurance and will be able to have proper follow up with MD at dc. Patient reports no needs at this time. CSW will discuss substance abuse with patient when he does not have visitors present.   Assessment/plan status:  Psychosocial Support/Ongoing Assessment of Needs Other assessment/ plan:   Information/referral to community resources:   Need to follow up for SBIRT    PATIENT'S/FAMILY'S RESPONSE TO PLAN OF CARE: Patient alert and oriented. Patient agreeable to CSW consult. Patient receptive to assessment.

## 2012-08-31 NOTE — Progress Notes (Addendum)
Arteriogram shows digital artery thrombosis.  No options for revascularization.  Hopefully thumb will improve with time. Needs follow up appt with hand surgery if worsens overtime.  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  No evidence of DVT.  Exam: Right first digit cool dusky distal phalanx unchanged 2+ radial pulse  D/c heparin Will sign off.  Fabienne Bruns, MD [facility] [place] Office: [phone] Pager: [phone]

## 2012-08-31 NOTE — Progress Notes (Signed)
Nutrition Brief Note  Patient identified on the Malnutrition Screening Tool (MST) report for unintentional weight loss and poor appetite, generating a score of 2. Pt states that he has lost weight r/t increased physical activity with work. Also has not had as much of an appetite with the heat. Pt states he had lost about 15 lbs, but now has gained all but 6 lbs back. Has a good appetite this morning.     Body mass index is 21.94 kg/(m^2). Pt meets criteria for WNL based on current BMI.   Current diet order is Regular, has not had a meal yet, anticipate 100% completion. Labs and medications reviewed.   No nutrition interventions warranted at this time. If nutrition issues arise, please consult RD.   Clarene Duke RD, LDN Pager [phone] After Hours pager [phone]

## 2012-08-31 NOTE — Discharge Summary (Signed)
Physician Discharge Summary  BYNTLEE CAREL YQI:[id number] DOB: February 19, 1981 DOA: 08/30/2012  PCP: No primary provider on file.  Admit date: 08/30/2012 Discharge date: 08/31/2012  Recommendations for Outpatient Follow-up:  1.  Follow up with Hand Surgeon on 10.23.2013 2.  Establish with a primary care physician for follow up on complications from IV drug use. 3.  Counseled to seek treatment for IV DA.  Given resource information on both [facility] and [facility]   Discharge Diagnoses:  Active Problems:  Mild anxiety  Limb ischemia IV Drug Use   Discharge Condition: stable  Diet recommendation:   Regular diet  History of present illness: Patient is a 31 y.o. year old male who presents for evaluation of numbness and tingling tip of right first digit. The patient began having some pain in the right thumb several days ago and was seen in the ER in [place] and placed on antibiotics. The pain in his thumb has worsened and the tip has turned white with some duskiness. No prior episodes. 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 States last use was 3 weeks ago. Has used all extremities in the past for injection. 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  Vascular surgery was consulted and performed an arteriogram on 08/31/2012.   Arteriogram results: occlusion of the digital arteries to the right found as well as the lateral branch to the right index finger.  No options for revascularization. Hopefully thumb will improve with time. Needs follow up appt with hand surgery if worsens overtime. 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 No evidence of DVT.   The patient has an appointment with Dr. Betha Loa, Hand surgery, on 09/15/12 for follow up.   Awaiting 2D echocardiogram & results to rule out endocarditis before discharge.   IV Drug Abuse. Social work consulted for counseling and resources. The patient has been seen at [facility] to stop IV drug use, but tells me he can not afford to continue there. After speaking with clinical social work,  I have given him information on [facility] treatment center in [place], as well as Dr. Jackquline Bosch clinic [facility].  Acute hep panel and HIV labs are pending.  I will follow these results.   Procedures: Arteriogram 08/31/2012  Consultations: Vascular Surgery Social Work  Discharge Exam: Filed Vitals:   08/31/12 1322  BP: 118/79  Pulse: 73  Temp: 97.8 F (36.6 C)  Resp: 18   Filed Vitals:   08/31/12 0743 08/31/12 0949 08/31/12 1000 08/31/12 1322  BP:  131/67 118/78 118/79  Pulse: 62 68 65 73  Temp:  98.1 F (36.7 C) 98.6 F (37 C) 97.8 F (36.6 C)  TempSrc:  Oral Oral Oral  Resp:  18 18 18   Height:      Weight:      SpO2:  100% 96% 97%   General: A&O, NAD Cardiovascular: rrr, no m/r/g Respiratory: cta, no w/c/r Extremities:  Right thumb pale appearance.  Discharge Instructions      Discharge Orders    Future Appointments: Provider: Department: Dept Phone: Center:   09/14/2012 1:30 PM Edwyna Perfect, MD [facility] [phone] [facility]     Future Orders Please Complete By Expires  Diet - low sodium heart healthy      Increase activity slowly          Medication List     As of 08/31/2012  4:27 PM    STOP taking these medications         sulfamethoxazole-trimethoprim 800-160 MG per tablet   Commonly known as: BACTRIM DS      TAKE these medications         carisoprodol 250 MG tablet   Commonly known as: SOMA   Take 250 mg by mouth 3 (three) times daily as needed. For pain      nicotine 14 mg/24hr patch   Commonly known as: NICODERM CQ - dosed in mg/24 hours   Place 1 patch  onto the skin daily.         Follow-up Information    Follow up with Tami Ribas, MD. On 09/15/2012. (See Dr. Betha Loa at 12:30 pm.)    Contact information:   [address] [place] [address] [phone]       Follow up with [facility].      Follow up with Lolita Patella, MD. On 09/13/2012. (9 am)    Contact information:   [facility] [address] [place] [address] [phone]           The results of significant diagnostics from this hospitalization (including imaging, microbiology, ancillary and laboratory) are listed below for reference.    Significant Diagnostic Studies: Dg Finger Thumb Right  08/30/2012  *RADIOLOGY REPORT*  Clinical Data: Right thumb pain, no injury  RIGHT THUMB 2+V  Comparison: None.  Findings: No fracture or dislocation is seen.  The joint spaces are preserved.  The visualized soft tissues are unremarkable.  IMPRESSION: No acute osseous abnormality is seen.   Original Report Authenticated By: Charline Bills, M.D.    08/31/2012  Vascular.  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 No stenosis, aneurysmal change, or dissection is visualized within the great vessels. The subclavian artery on the right is widely patent without evidence of aneurysmal change or dissection.  Right Upper Extremity: The right axillary and brachial artery are widely patent without disease present. The radial, ulnar, and interosseous arteries are normal. The radial artery is the dominant vessel to the wrist. The palmar arch is intact. The radial artery is dominant. The digital artery to the right from is occluded as is the lateral digital branch to the index finger.  There is occlusion of the digital arteries to the right found as well as the lateral branch to the right index finger   Labs: Basic Metabolic Panel:  Lab 08/31/12 6295 08/30/12 1405  NA 136 136  K 3.8  5.2*  CL 102 102  CO2 25 23  GLUCOSE 106* 88  BUN 7 10  CREATININE 0.73 0.72  CALCIUM 8.9 9.6  MG -- --  PHOS -- --   Liver Function Tests:  Lab 08/31/12 1244 08/30/12 1743  AST 21 22  ALT 15 15  ALKPHOS 75 78  BILITOT 0.2* 0.3  PROT 7.0 7.9  ALBUMIN 3.7 4.3   CBC:  Lab 08/31/12 1244 08/30/12 2139 08/30/12 1405  WBC 4.5 7.5 7.3  NEUTROABS -- -- 3.6  HGB 14.2 15.3 15.0  HCT 40.6 43.3 41.9  MCV 92.3 91.9 92.1  PLT 205 247 260   Cardiac Enzymes:  Lab 08/31/12 1244 08/31/12 0123 08/30/12 1916  CKTOTAL -- -- --  CKMB -- -- --  CKMBINDEX -- -- --  TROPONINI <0.30 <0.30 <0.30    Time coordinating discharge: 40 min.  Signed:  Algis Downs, PA-C [facility] Pager: [phone]  [facility] 08/31/2012, 4:27 PM

## 2012-08-31 NOTE — Op Note (Signed)
[facility]   Patient name: Scott Christian MRN: [id number] DOB: 1981-11-21 Sex: male  08/30/2012 - 08/31/2012 Pre-operative Diagnosis: Digital ischemia, right from Post-operative diagnosis:  Same Surgeon:  Jorge Ny Procedure Performed:  1.  ultrasound access right femoral artery  2.  aortic arch angiogram  3.  third order catheterization (right brachial artery)  4.  right upper extremity runoff  5.  femoral artery closure (Mynx)     Indications:  The patient was admitted with ischemic changes to his right from. He has a history of IV drug abuse. He comes in for arteriogram to better define his anatomy and to evaluate for a possible source of his ischemic changes. The procedure was discussed with the patient informed consent was obtained.  Procedure:  The patient was identified in the holding area and taken to room 8.  The patient was then placed supine on the table and prepped and draped in the usual sterile fashion.  A time out was called.  Ultrasound was used to evaluate the right common femoral artery.  It was patent .  A digital ultrasound image was acquired. The right common femoral artery was accessed under ultrasound guidance with an 18-gauge needle. A 035 wire was advanced without resistance and a 5 French sheath was placed. Over the wire a pigtail catheter was advanced into the descending aorta and a aortic arch angiogram was performed. Next using the [facility] 2 catheter, the innominate artery was selected. The catheter was advanced initially into the right subclavian artery and then into the right brachial artery for evaluation of the right upper extremity.  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 No stenosis, aneurysmal change, or dissection is visualized within the great vessels. The subclavian artery on the right is widely patent without evidence of aneurysmal change or dissection.  Right Upper Extremity:   The right axillary and brachial artery are widely patent without disease present. The radial, ulnar, and interosseous arteries are normal. The radial artery is the dominant vessel to the wrist. The palmar arch is intact. The radial artery is dominant. The digital artery to the right from is occluded as is the lateral digital branch to the index finger.  After the above images were obtained catheters and wires were removed. A retrograde femoral antrum was performed to evaluate for closure and the Mynx device was successfully deployed    Intervention:  None  Impression:  #1  type I aortic arch  #2  no evidence of dissection, aneurysm, or dissection identified within the great vessels, the right subclavian , axillary, and brachial artery.  #3  the right palmar arch is intact with the radial artery being dominant.  #4  there is occlusion of the digital arteries to the right found as well as the lateral branch to the right index finger    V. Durene Cal, M.D. [facility] [place] Office: [phone] Pager:  [phone]

## 2012-08-31 NOTE — Progress Notes (Signed)
Clinical Social Work  CSW met with patient alone in room. CSW and patient discussed patient's drug use. Patient admits to using 3 weeks ago in his hand. Patient reports it was a "stupid move" and knows his hand was affected from drug use. Patient reports that he goes to [facility] through [facility]. Patient reports that it is very expensive and he is unsure if he will remain on treatment once child is born. CSW encouraged patient to discuss plans with clinic before he eliminates use altogether. Patient agreeable to plan. Patient reports that girlfriend is unaware of any use and does not want it discussed in front on her. Patient reports that previous marriage was affected by drug use and is afraid that girlfriend will leave him if she discovers use.  CSW and patient discussed therapy as well. Patient feels that he uses due to physical needs but after discussing triggers, patient admits that once he gets into an argument with girlfriend that he uses. Patient agreeable to consider individual or couples therapy. Patient does not desire any referrals to be listed on AVS due to worrying that others will see. Patient agreeable to call insurance regarding in-network agencies and agreeable to discuss his treatment options at clinic.  CSW is signing off but available if needed.  [place], [place] [phone]

## 2012-08-31 NOTE — Progress Notes (Signed)
Addendum  Patient seen and examined, chart and data base reviewed.  I agree with the above assessment and plan.  For full details please see Mrs. Algis Downs PA. Note.  Waiting on 2-D echocardiogram to rule out vegetation and intra-ventricular clots.   Clint Lipps, MD [facility] Pager: [phone] 08/31/2012, 7:06 PM

## 2012-08-31 NOTE — Discharge Summary (Signed)
Addendum  Patient seen and examined, chart and data base reviewed.  I agree with the above assessment and discharge plan.  For full details please see Mrs. Algis Downs PA. Note.  Right first digit ischemia secondary to digital artery thrombosis, per vascular surgery nauseous for revascularization.  2-D echocardiogram is negative for visible vegetations or intraventricular clots.  Patient to followup with primary care physician, likely thrombosis secondary to repetitive trauma from intravenous recreational drug use.   Clint Lipps, MD [facility] Pager: [phone] 08/31/2012, 7:07 PM

## 2012-08-31 NOTE — Progress Notes (Signed)
Patient ID: Scott Christian, male   DOB: May 17, 1981, 31 y.o.   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 Arteriogram results:  occlusion of the digital arteries to the right found as well as the lateral branch to the right index finger. No options for revascularization. Hopefully thumb will improve with time. Needs follow up appt with hand surgery if worsens overtime. 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 No evidence of DVT.  Will arrange for Hand surgery follow up in 2 weeks.   Awaiting 2D echocardiogram & results to rule out endocarditis before discharge.  IV Drug Abuse.  Social work consulted for counseling and resources.  The patient is being seen at [facility] to stop IV drug use.  Acute hep panel and HIV labs are pending.   Code Status: full code Family Communication:  Wife at bedside Disposition Plan: to home when ready.  Brief narrative: 31 year old male presents to the ER complaining of constant, moderate right thumb pain onset one week ago. The patient admits to IV drug use 3 months and then 3 weeks ago. He states that he has injected almost everywhere, he has been using IV Suboxone. He denies any fever chills rigors weight loss, any chest pain shortness of breath or any pain in his right upper extremity. He denies any prior history of DVT or PE.  The pain in his thumb has worsened and the tip has turned white with some duskiness. No prior episodes. 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 States last use was 3 weeks ago. Has used all extremities  in the past for injection. Thrombosed cephalic vein noted on ultrasound earlier today by report. Other medical problems include none.   Consultants:  Vascular Surgery  Procedures:  Arteriogram 08/31/2012  Antibiotics:  Vancomycin  Subjective: Patient with mild pain in right thumb.  Has questions regarding how long it will take to heal.  Requests work note and referral to Hydrographic surveyor.  Objective: Filed Vitals:   08/31/12 0454 08/31/12 0743 08/31/12 0949 08/31/12 1000  BP: 106/68  131/67 118/78  Pulse: 60 62 68 65  Temp: 98.5 F (36.9 C)  98.1 F (36.7 C) 98.6 F (37 C)  TempSrc: Oral  Oral Oral  Resp: 18  18 18   Height:      Weight:      SpO2: 95%  100% 96%    Intake/Output Summary (Last 24 hours) at 08/31/12 1127 Last data filed at 08/31/12 0830  Gross per 24 hour  Intake  637.5 ml  Output      0 ml  Net  637.5 ml    Exam:   General:  A&O, NAD  Cardiovascular: RRR, No M/R/G, no Lower Extremity Edema  Respiratory: CTA, No increased work of breathing  Abdomen: Soft, NT, ND, positive bowel sounds, no obvious masses  Neurological:  Cranial Nerves 2 - 12 are grossly intact, exam non focal  Psychiatric  Skin:  No rashes, bruises, or lesions  Data Reviewed: Basic Metabolic Panel:  Lab 08/30/12 9562  NA 136  K 5.2*  CL 102  CO2 23  GLUCOSE  88  BUN 10  CREATININE 0.72  CALCIUM 9.6  MG --  PHOS --   Liver Function Tests:  Lab 08/30/12 1743  AST 22  ALT 15  ALKPHOS 78  BILITOT 0.3  PROT 7.9  ALBUMIN 4.3   No results found for this basename: LIPASE:5,AMYLASE:5 in the last 168 hours No results found for this basename: AMMONIA:5 in the last 168 hours CBC:  Lab 08/30/12 2139 08/30/12 1405  WBC 7.5 7.3  NEUTROABS -- 3.6  HGB 15.3 15.0  HCT 43.3 41.9  MCV 91.9 92.1  PLT 247 260   Cardiac Enzymes:  Lab 08/31/12 0123 08/30/12 1916  CKTOTAL -- --  CKMB -- --  CKMBINDEX -- --  TROPONINI <0.30 <0.30   BNP (last 3 results) No results  found for this basename: PROBNP:3 in the last 8760 hours CBG: No results found for this basename: GLUCAP:5 in the last 168 hours  No results found for this or any previous visit (from the past 240 hour(s)).   Studies: Dg Finger Thumb Right  08/30/2012  *RADIOLOGY REPORT*  Clinical Data: Right thumb pain, no injury  RIGHT THUMB 2+V  Comparison: None.  Findings: No fracture or dislocation is seen.  The joint spaces are preserved.  The visualized soft tissues are unremarkable.  IMPRESSION: No acute osseous abnormality is seen.   Original Report Authenticated By: Charline Bills, M.D.     Scheduled Meds:   . enoxaparin (LOVENOX) injection  70 mg Subcutaneous Once  . fentaNYL      . heparin      . lidocaine      . midazolam      . nicotine  14 mg Transdermal Daily  . oxyCODONE-acetaminophen  2 tablet Oral Once  . pneumococcal 23 valent vaccine  0.5 mL Intramuscular Tomorrow-1000  . sodium chloride  3 mL Intravenous Q12H  . vancomycin  1,000 mg Intravenous Once  . vancomycin  1,000 mg Intravenous Q8H   Continuous Infusions:   . sodium chloride    . DISCONTD: sodium chloride 75 mL/hr at 08/31/12 0550  . DISCONTD: heparin Stopped (08/31/12 1610)     Stephani Police [facility] Pager [phone]  If 7PM-7AM, please contact night-coverage [email or web address] Password [email or web address] 08/31/2012, 11:27 AM   LOS: 1 day

## 2012-08-31 NOTE — Progress Notes (Signed)
Scott Christian to be D/C'd Home per MD order.  Discussed with the patient and all questions fully answered.   [name], [name]  Home Medication Instructions [id number]   Printed on:08/31/12 1823  Medication Information                    carisoprodol (SOMA) 250 MG tablet Take 250 mg by mouth 3 (three) times daily as needed. For pain           nicotine (NICODERM CQ - DOSED IN MG/24 HOURS) 14 mg/24hr patch Place 1 patch onto the skin daily.             VVS, Skin clean, dry and intact without evidence of skin break down, no evidence of skin tears noted. IV catheter discontinued intact. Site without signs and symptoms of complications. Dressing and pressure applied.  An After Visit Summary was printed and given to the patient. Follow up appointments , new prescriptions and medication administration times given Patient escorted via WC, and D/C home via private auto.  Cindra Eves, RN 08/31/2012 6:23 PM

## 2012-08-31 NOTE — Care Management Note (Signed)
    Page 1 of 1   09/07/2012     6:14:24 PM   CARE MANAGEMENT NOTE 09/07/2012  Patient:  Scott Christian, Scott Christian   Account Number:  [id number]  Date Initiated:  08/31/2012  Documentation initiated by:  Letha Cape  Subjective/Objective Assessment:   dx anxiety, ischemic thumb  admit- lives with fiance, pta independent.     Action/Plan:   Anticipated DC Date:  08/31/2012   Anticipated DC Plan:  HOME/SELF CARE      DC Planning Services  CM consult  Follow-up appt scheduled      Choice offered to / List presented to:             Status of service:  Completed, signed off Medicare Important Message given?   (If response is "NO", the following Medicare IM given date fields will be blank) Date Medicare IM given:   Date Additional Medicare IM given:    Discharge Disposition:  HOME/SELF CARE  Per UR Regulation:  Reviewed for med. necessity/level of care/duration of stay  If discussed at Long Length of Stay Meetings, dates discussed:    Comments:  08/31/12 Letha Cape RN, BSN [phone] patient lives with fiance, pta indpendent.  No needs anticipated.  Awaiting 2 decho results, then pt for possible dc.

## 2012-08-31 NOTE — H&P (Signed)
Consult Note  Requesting: [facility]  Reason for consult: right hand ischemia  History of Present Illness: Patient is a 31 y.o. year old male who presents for evaluation of numbness and tingling tip of right first digit. The patient began having some pain in the right thumb several days ago and was seen in the ER in [place] and placed on antibiotics. The pain in his thumb has worsened and the tip has turned white with some duskiness. No prior episodes. 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 States last use was 3 weeks ago. Has used all extremities in the past for injection. Thrombosed cephalic vein noted on ultrasound earlier today by report. Other medical problems include none.  Past Medical History   Diagnosis  Date   .  History of chicken pox     Past Surgical History   Procedure  Date   .  No past surgeries  09/23/2011     denies surgical history    Social History  History   Substance Use Topics   .  Smoking status:  Current Every Day Smoker -- 0.5 packs/day     Types:  Cigarettes   .  Smokeless tobacco:  Never Used     Comment: 1/2 ppd    .  Alcohol Use:  No    Family History  Family History   Problem  Relation  Age of Onset   .  Uterine cancer  Mother    .  Diabetes  Neg Hx    .  Breast cancer  Maternal Grandmother    .  Hyperlipidemia  Father    .  Hyperlipidemia  Paternal Grandfather    .  Stroke  Paternal Grandfather    .  Hypertension  Father    .  Hypertension  Paternal Grandfather     Allergies  Allergies   Allergen  Reactions   .  Nickel  Rash    Current Facility-Administered Medications   Medication  Dose  Route  Frequency  Provider  Last Rate  Last Dose   .  enoxaparin (LOVENOX) injection 70 mg  70 mg  Subcutaneous  Once  Hessie Diener Hendron, PHARMD   70 mg at 08/30/12 1752   .  vancomycin (VANCOCIN) IVPB 1000 mg/200 mL premix  1,000 mg   Intravenous  Once  Fredrik Rigger, PHARMD   1,000 mg at 08/30/12 1752    Current Outpatient Prescriptions   Medication  Sig  Dispense  Refill   .  carisoprodol (SOMA) 250 MG tablet  Take 250 mg by mouth 3 (three) times daily as needed. For pain     .  sulfamethoxazole-trimethoprim (BACTRIM DS) 800-160 MG per tablet  Take 1 tablet by mouth 2 (two) times daily. Started on 08/27/12. For 10 days      ROS:  General: No weight loss, Fever, chills  HEENT: No recent headaches, no nasal bleeding, no visual changes, no sore throat  Neurologic: No dizziness, blackouts, seizures. No recent symptoms of stroke or mini- stroke. No recent episodes of slurred speech, or temporary blindness.  Cardiac: No recent episodes of chest pain/pressure, no shortness of breath at rest. No shortness of breath with exertion. Denies history of atrial fibrillation or irregular heartbeat  Vascular: No history of rest pain in feet. No history of claudication. No history of non-healing ulcer, No history of DVT  Pulmonary:  No home oxygen, no productive cough, no hemoptysis, No asthma or wheezing  Musculoskeletal: [ ]  Arthritis, [ ]  Low back pain, [ ]  Joint pain  Hematologic:No history of hypercoagulable state. No history of easy bleeding. No history of anemia  Gastrointestinal: No hematochezia or melena, No gastroesophageal reflux, no trouble swallowing  Urinary: [ ]  chronic Kidney disease, [ ]  on HD - [ ]  MWF or [ ]  TTHS, [ ]  Burning with urination, [ ]  Frequent urination, [ ]  Difficulty urinating;  Skin: No rashes  Psychological: + history of anxiety, No history of depression  Physical Examination  Filed Vitals:    08/30/12 1219  08/30/12 1500  08/30/12 1804   BP:  139/69   128/83   Pulse:  85   71   Temp:  99 F (37.2 C)   98.3 F (36.8 C)   TempSrc:    Oral   Resp:  16   20   Height:   5' 10.87" (1.8 m)    Weight:   156 lb 12 oz (71.1 kg)    SpO2:  99%   99%    Body mass index is 21.94 kg/(m^2).  General:  Alert and oriented, no acute distress  HEENT: Normal  Neck: No bruit or JVD  Pulmonary: Clear to auscultation bilaterally  Cardiac: Regular Rate and Rhythm without murmur  Abdomen: Soft, non-tender, non-distended, no mass, no scars  Skin: No rash, right first digit cool with purpura at the distal phalanx and a pale digit tip, sensation intact except distal tip, all other digits normal, some purpura along the thenar eminence with mild tenderness, no mass, needle marks dorsal foot bilat, palpable cord over right cephalic vein no erythema  Extremity Pulses: 2+ radial, brachial, femoral, absent dorsalis pedis, 2+ posterior tibial pulses bilaterally  Musculoskeletal: No deformity or edema  Neurologic: Upper and lower extremity motor 5/5 and symmetric  DATA: Hand xray no fracture  ASSESSMENT: Ischemia right first digit with intact radial brachial ulnar. Possible thrombosis (from IVDA or trauma) vs embolization (as above) vs Buerger's variant less likely vasculitis or cardiac origin  PLAN:  Right upper extremity arteriogram tomorrow  NPO post midnight  Will d/c Lovenox in preference for heparin since is can be easily turned off and goes away quickly  Will proceed with right arm angio   Durene Cal

## 2012-08-31 NOTE — Progress Notes (Signed)
Clinical Social Work  CSW received referral during progression due to patient using IV drugs. CSW attempted to meet with patient but patient had visitors present and asked CSW to come back at a later time. CSW will follow up at a more appropriate time.  [place], [place] [phone]

## 2012-09-02 LAB — MPO/PR-3 (ANCA) ANTIBODIES: Serine Protease 3: 3 AU/mL (ref <20)

## 2012-09-02 LAB — ANTI-DNA ANTIBODY, DOUBLE-STRANDED: ds DNA Ab: 4 IU/mL (ref <30)

## 2012-09-02 LAB — ANTI-SCLERODERMA ANTIBODY: Scleroderma (Scl-70) (ENA) Antibody, IgG: 1 AU/mL (ref <30)

## 2012-09-06 LAB — CULTURE, BLOOD (ROUTINE X 2): Culture: NO GROWTH

## 2012-09-14 ENCOUNTER — Ambulatory Visit: Payer: BC Managed Care – PPO | Admitting: Internal Medicine

## 2014-11-02 ENCOUNTER — Encounter (HOSPITAL_COMMUNITY): Payer: Self-pay | Admitting: Surgery

## 2015-02-25 ENCOUNTER — Emergency Department (HOSPITAL_COMMUNITY)
Admission: EM | Admit: 2015-02-25 | Discharge: 2015-02-25 | Disposition: A | Discharge status: Home / Self Care | Payer: BLUE CROSS/BLUE SHIELD | Attending: Emergency Medicine | Admitting: Emergency Medicine

## 2015-02-25 ENCOUNTER — Emergency Department (HOSPITAL_COMMUNITY): Payer: BLUE CROSS/BLUE SHIELD

## 2015-02-25 ENCOUNTER — Encounter (HOSPITAL_COMMUNITY): Payer: Self-pay | Admitting: Cardiology

## 2015-02-25 DIAGNOSIS — S42001A Fracture of unspecified part of right clavicle, initial encounter for closed fracture: Secondary | ICD-10-CM

## 2015-02-25 DIAGNOSIS — Y9389 Activity, other specified: Secondary | ICD-10-CM | POA: Diagnosis not present

## 2015-02-25 DIAGNOSIS — Z8619 Personal history of other infectious and parasitic diseases: Secondary | ICD-10-CM | POA: Insufficient documentation

## 2015-02-25 DIAGNOSIS — S4991XA Unspecified injury of right shoulder and upper arm, initial encounter: Secondary | ICD-10-CM | POA: Diagnosis present

## 2015-02-25 DIAGNOSIS — Z720 Tobacco use: Secondary | ICD-10-CM | POA: Diagnosis not present

## 2015-02-25 DIAGNOSIS — S42021A Displaced fracture of shaft of right clavicle, initial encounter for closed fracture: Secondary | ICD-10-CM | POA: Insufficient documentation

## 2015-02-25 DIAGNOSIS — Y998 Other external cause status: Secondary | ICD-10-CM | POA: Diagnosis not present

## 2015-02-25 DIAGNOSIS — T1490XA Injury, unspecified, initial encounter: Secondary | ICD-10-CM

## 2015-02-25 DIAGNOSIS — Y92410 Unspecified street and highway as the place of occurrence of the external cause: Secondary | ICD-10-CM | POA: Diagnosis not present

## 2015-02-25 MED ORDER — HYDROMORPHONE HCL 1 MG/ML IJ SOLN
0.5000 mg | Freq: Once | INTRAMUSCULAR | Status: AC
  Administered 2015-02-25: 0.5 mg via INTRAVENOUS
  Filled 2015-02-25: qty 1

## 2015-02-25 MED ORDER — HYDROMORPHONE HCL 1 MG/ML IJ SOLN
1.0000 mg | Freq: Once | INTRAMUSCULAR | Status: AC
  Administered 2015-02-25: 1 mg via INTRAVENOUS
  Filled 2015-02-25: qty 1

## 2015-02-25 MED ORDER — OXYCODONE-ACETAMINOPHEN 5-325 MG PO TABS: 1.0000 | ORAL_TABLET | Freq: Four times a day (QID) | ORAL | Status: DC | PRN

## 2015-02-25 MED ORDER — ONDANSETRON HCL 4 MG/2ML IJ SOLN
4.0000 mg | Freq: Once | INTRAMUSCULAR | Status: AC
  Administered 2015-02-25: 4 mg via INTRAMUSCULAR
  Filled 2015-02-25: qty 2

## 2015-02-25 MED ORDER — SODIUM CHLORIDE 0.9 % IV BOLUS (SEPSIS)
500.0000 mL | Freq: Once | INTRAVENOUS | Status: AC
  Administered 2015-02-25: 500 mL via INTRAVENOUS

## 2015-02-25 MED ORDER — IOHEXOL 350 MG/ML SOLN
100.0000 mL | Freq: Once | INTRAVENOUS | Status: AC | PRN
  Administered 2015-02-25: 100 mL via INTRAVENOUS
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
## 2015-02-25 NOTE — Discharge Instructions (Signed)
Follow up with dr. Romeo AppleHarrison or another orthopedic md this week.

## 2015-02-25 NOTE — ED Notes (Signed)
Bruising noted to patients right clavical area. Pt is alert and oriented at this time.
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
## 2015-02-26 ENCOUNTER — Telehealth: Payer: Self-pay | Admitting: Orthopedic Surgery

## 2015-02-26 NOTE — Telephone Encounter (Signed)
Call received from patient(ph# [phone]) earlier today, following Emergency Room visit at [facility] for problem of fracture of clavicle; relayed Dr Romeo AppleHarrison had been out of office; reviewing with him regarding scheduling.    Per review of scheduled, offered patient first available, and offered wait list; patient relayed that he cannot wait that long, and relayed he's been in pain, and will make other arrangements.

## 2015-02-28 MED FILL — Oxycodone w/ Acetaminophen Tab 5-325 MG: ORAL | Qty: 6 | Status: AC

## 2015-05-25 HISTORY — PX: CLAVICLE SURGERY: SHX598

## 2015-06-18 ENCOUNTER — Ambulatory Visit (HOSPITAL_COMMUNITY)
Admission: RE | Admit: 2015-06-18 | Discharge: 2015-06-18 | Disposition: A | Discharge status: Home / Self Care | Payer: BLUE CROSS/BLUE SHIELD | Source: Ambulatory Visit | Attending: Orthopedic Surgery | Admitting: Orthopedic Surgery

## 2015-06-18 ENCOUNTER — Other Ambulatory Visit (HOSPITAL_COMMUNITY): Payer: Self-pay | Admitting: Orthopedic Surgery

## 2015-06-18 DIAGNOSIS — Z0389 Encounter for observation for other suspected diseases and conditions ruled out: Secondary | ICD-10-CM | POA: Diagnosis not present

## 2015-06-18 DIAGNOSIS — M795 Residual foreign body in soft tissue: Secondary | ICD-10-CM

## 2015-07-27 NOTE — Pre-Procedure Instructions (Addendum)
Callin Ashe Unangst  07/27/2015      [id number] - [place], [place] - [address] [place] [address] Phone: [phone] Fax: [phone]    Your procedure is scheduled on September 8, Thursday   Report to [facility] Admitting at 5:30 AM  Call this number if you have problems the morning of surgery:  [phone]   Remember:  Do not eat food or drink liquids after midnight Wednesday.  Take these medicines the morning of surgery with A SIP OF WATER : Hydrocodone.              Please stop taking any herbal medications / supplements, anti-inflammatories, aspirin 4-5 days prior to surgery.   Do not wear jewelry - no rings or watches.  Do not wear lotions or colognes.               Men may shave face and neck.   Do not bring valuables to the hospital.  [facility] is not responsible for any belongings or valuables.  Contacts, dentures or bridgework may not be worn into surgery.  Leave your suitcase in the car.  After surgery it may be brought to your room. For patients admitted to the hospital, discharge time will be determined by your treatment team.  Name and phone number of your driver:    PLEASE DO NOT FORGET THE BRACE!  Special instructions: "Preparing for Surgery" instruction sheet.  Please read over the following fact sheets that you were given. Pain Booklet, Coughing and Deep Breathing, MRSA Information and Surgical Site Infection Prevention

## 2015-07-31 ENCOUNTER — Encounter (HOSPITAL_COMMUNITY): Payer: Self-pay | Admitting: *Deleted

## 2015-07-31 ENCOUNTER — Encounter (HOSPITAL_COMMUNITY): Payer: Self-pay

## 2015-07-31 ENCOUNTER — Inpatient Hospital Stay (HOSPITAL_COMMUNITY)
Admission: RE | Admit: 2015-07-31 | Discharge: 2015-07-31 | Disposition: A | Discharge status: Home / Self Care | Payer: BLUE CROSS/BLUE SHIELD | Source: Ambulatory Visit

## 2015-07-31 NOTE — Pre-Procedure Instructions (Signed)
Jane Broughton Resler  07/31/2015      [facility] - [place], [place] - [address] [place] [address] Phone: [phone] Fax: [phone]    Your procedure is scheduled on Thurs, Sept 8 @ 7:30 AM  Report to [facility]mitting at 5:30 AM  Call this number if you have problems the morning of surgery:  [phone]   Remember:  Do not eat food or drink liquids after midnight.  Take these medicines the morning of surgery with A SIP OF WATER Pain Pill(if needed) and Lamictal(Lamotrigine)             Stop taking your Ibuprofen. No Goody's,BC's,Aleve,Aspirin,Aleve,Fish Oil,or any Herbal Medications.    Do not wear jewelry.  Do not wear lotions, powders, or colognes.  You may wear deodorant.  Men may shave face and neck.  Do not bring valuables to the hospital.  [facility] is not responsible for any belongings or valuables.  Contacts, dentures or bridgework may not be worn into surgery.  Leave your suitcase in the car.  After surgery it may be brought to your room.  For patients admitted to the hospital, discharge time will be determined by your treatment team.  Patients discharged the day of surgery will not be allowed to drive home.    Special instructions:  [facility] - Preparing for Surgery  Before surgery, you can play an important role.  Because skin is not sterile, your skin needs to be as free of germs as possible.  You can reduce the number of germs on you skin by washing with CHG (chlorahexidine gluconate) soap before surgery.  CHG is an antiseptic cleaner which kills germs and bonds with the skin to continue killing germs even after washing.  Please DO NOT use if you have an allergy to CHG or antibacterial soaps.  If your skin becomes reddened/irritated stop using the CHG and inform your nurse when you arrive at Short Stay.  Do not shave (including legs and underarms) for at least 48 hours prior to the first CHG  shower.  You may shave your face.  Please follow these instructions carefully:   1.  Shower with CHG Soap the night before surgery and the                                morning of Surgery.  2.  If you choose to wash your hair, wash your hair first as usual with your       normal shampoo.  3.  After you shampoo, rinse your hair and body thoroughly to remove the                      Shampoo.  4.  Use CHG as you would any other liquid soap.  You can apply chg directly       to the skin and wash gently with scrungie or a clean washcloth.  5.  Apply the CHG Soap to your body ONLY FROM THE NECK DOWN.        Do not use on open wounds or open sores.  Avoid contact with your eyes,       ears, mouth and genitals (private parts).  Wash genitals (private parts)       with your normal soap.  6.  Wash thoroughly, paying special attention to the area where your surgery  will be performed.  7.  Thoroughly rinse your body with warm water from the neck down.  8.  DO NOT shower/wash with your normal soap after using and rinsing off       the CHG Soap.  9.  Pat yourself dry with a clean towel.            10.  Wear clean pajamas.            11.  Place clean sheets on your bed the night of your first shower and do not        sleep with pets.  Day of Surgery  Do not apply any lotions/deoderants the morning of surgery.  Please wear clean clothes to the hospital/surgery center.    Please read over the following fact sheets that you were given. Pain Booklet, Coughing and Deep Breathing, MRSA Information and Surgical Site Infection Prevention

## 2015-08-02 ENCOUNTER — Ambulatory Visit (HOSPITAL_COMMUNITY): Payer: BLUE CROSS/BLUE SHIELD | Admitting: Certified Registered Nurse Anesthetist

## 2015-08-02 ENCOUNTER — Encounter (HOSPITAL_COMMUNITY)
Admission: RE | Disposition: A | Discharge status: Home / Self Care | Payer: Self-pay | Source: Ambulatory Visit | Attending: Orthopedic Surgery

## 2015-08-02 ENCOUNTER — Encounter (HOSPITAL_COMMUNITY): Payer: Self-pay | Admitting: *Deleted

## 2015-08-02 ENCOUNTER — Ambulatory Visit (HOSPITAL_COMMUNITY): Payer: BLUE CROSS/BLUE SHIELD

## 2015-08-02 ENCOUNTER — Observation Stay (HOSPITAL_COMMUNITY): Payer: BLUE CROSS/BLUE SHIELD

## 2015-08-02 ENCOUNTER — Observation Stay (HOSPITAL_COMMUNITY)
Admission: RE | Admit: 2015-08-02 | Discharge: 2015-08-02 | Disposition: A | Discharge status: Home / Self Care | Payer: BLUE CROSS/BLUE SHIELD | Source: Ambulatory Visit | Attending: Orthopedic Surgery | Admitting: Orthopedic Surgery

## 2015-08-02 DIAGNOSIS — F17210 Nicotine dependence, cigarettes, uncomplicated: Secondary | ICD-10-CM | POA: Insufficient documentation

## 2015-08-02 DIAGNOSIS — M542 Cervicalgia: Secondary | ICD-10-CM | POA: Diagnosis present

## 2015-08-02 DIAGNOSIS — M5012 Cervical disc disorder with radiculopathy, mid-cervical region: Principal | ICD-10-CM | POA: Insufficient documentation

## 2015-08-02 DIAGNOSIS — Z981 Arthrodesis status: Secondary | ICD-10-CM

## 2015-08-02 DIAGNOSIS — Z419 Encounter for procedure for purposes other than remedying health state, unspecified: Secondary | ICD-10-CM

## 2015-08-02 HISTORY — DX: Headache, unspecified: R51.9

## 2015-08-02 HISTORY — DX: Unspecified osteoarthritis, unspecified site: M19.90

## 2015-08-02 HISTORY — PX: CERVICAL DISC ARTHROPLASTY: SHX587

## 2015-08-02 HISTORY — DX: Paresthesia of skin: R20.2

## 2015-08-02 HISTORY — DX: Headache: R51

## 2015-08-02 LAB — BASIC METABOLIC PANEL
ANION GAP: 7 (ref 5–15)
BUN: 14 mg/dL (ref 6–20)
CHLORIDE: 104 mmol/L (ref 101–111)
CO2: 28 mmol/L (ref 22–32)
Calcium: 8.9 mg/dL (ref 8.9–10.3)
Creatinine, Ser: 0.89 mg/dL (ref 0.61–1.24)
Glucose, Bld: 93 mg/dL (ref 65–99)
POTASSIUM: 3.8 mmol/L (ref 3.5–5.1)
SODIUM: 139 mmol/L (ref 135–145)

## 2015-08-02 LAB — CBC
HCT: 43.5 % (ref 39.0–52.0)
HEMOGLOBIN: 14.8 g/dL (ref 13.0–17.0)
MCH: 32.8 pg (ref 26.0–34.0)
MCHC: 34 g/dL (ref 30.0–36.0)
MCV: 96.5 fL (ref 78.0–100.0)
PLATELETS: 216 10*3/uL (ref 150–400)
RBC: 4.51 MIL/uL (ref 4.22–5.81)
RDW: 12.2 % (ref 11.5–15.5)
WBC: 6.4 10*3/uL (ref 4.0–10.5)

## 2015-08-02 LAB — PROTIME-INR
INR: 1.12 (ref 0.00–1.49)
Prothrombin Time: 14.6 seconds (ref 11.6–15.2)

## 2015-08-02 LAB — SURGICAL PCR SCREEN
MRSA, PCR: NEGATIVE
STAPHYLOCOCCUS AUREUS: NEGATIVE

## 2015-08-02 SURGERY — CERVICAL ANTERIOR DISC ARTHROPLASTY
Anesthesia: General | Site: Neck

## 2015-08-02 MED ORDER — CEFAZOLIN SODIUM 1-5 GM-% IV SOLN
1.0000 g | Freq: Three times a day (TID) | INTRAVENOUS | Status: DC
  Administered 2015-08-02: 1 g via INTRAVENOUS
  Filled 2015-08-02 (×2): qty 50

## 2015-08-02 MED ORDER — METHOCARBAMOL 500 MG PO TABS
500.0000 mg | ORAL_TABLET | Freq: Four times a day (QID) | ORAL | Status: DC | PRN
  Administered 2015-08-02: 500 mg via ORAL
  Filled 2015-08-02 (×2): qty 1

## 2015-08-02 MED ORDER — DEXAMETHASONE SODIUM PHOSPHATE 4 MG/ML IJ SOLN
INTRAMUSCULAR | Status: AC
  Filled 2015-08-02: qty 2

## 2015-08-02 MED ORDER — BUPIVACAINE-EPINEPHRINE 0.25% -1:200000 IJ SOLN
INTRAMUSCULAR | Status: DC | PRN
  Administered 2015-08-02: 5 mL

## 2015-08-02 MED ORDER — GLYCOPYRROLATE 0.2 MG/ML IJ SOLN
INTRAMUSCULAR | Status: DC | PRN
  Administered 2015-08-02: 0.2 mg via INTRAVENOUS
  Administered 2015-08-02: 0.6 mg via INTRAVENOUS

## 2015-08-02 MED ORDER — MIDAZOLAM HCL 2 MG/2ML IJ SOLN
INTRAMUSCULAR | Status: AC
  Filled 2015-08-02: qty 4

## 2015-08-02 MED ORDER — ONDANSETRON HCL 4 MG/2ML IJ SOLN: 4.0000 mg | INTRAMUSCULAR | Status: DC | PRN

## 2015-08-02 MED ORDER — ACETAMINOPHEN 10 MG/ML IV SOLN
1000.0000 mg | Freq: Four times a day (QID) | INTRAVENOUS | Status: DC
  Administered 2015-08-02: 1000 mg via INTRAVENOUS
  Filled 2015-08-02: qty 100

## 2015-08-02 MED ORDER — CEFAZOLIN SODIUM-DEXTROSE 2-3 GM-% IV SOLR
2.0000 g | INTRAVENOUS | Status: AC
  Administered 2015-08-02: 2 g via INTRAVENOUS

## 2015-08-02 MED ORDER — HYDROMORPHONE HCL 1 MG/ML IJ SOLN
0.2500 mg | INTRAMUSCULAR | Status: DC | PRN
  Administered 2015-08-02 (×4): 0.5 mg via INTRAVENOUS

## 2015-08-02 MED ORDER — EPHEDRINE SULFATE 50 MG/ML IJ SOLN
INTRAMUSCULAR | Status: DC | PRN
  Administered 2015-08-02: 10 mg via INTRAVENOUS

## 2015-08-02 MED ORDER — HYDROMORPHONE HCL 1 MG/ML IJ SOLN
INTRAMUSCULAR | Status: AC
  Filled 2015-08-02: qty 1

## 2015-08-02 MED ORDER — METHOCARBAMOL 1000 MG/10ML IJ SOLN
500.0000 mg | Freq: Four times a day (QID) | INTRAVENOUS | Status: DC | PRN
  Administered 2015-08-02: 500 mg via INTRAVENOUS
  Filled 2015-08-02 (×3): qty 5

## 2015-08-02 MED ORDER — PHENYLEPHRINE HCL 10 MG/ML IJ SOLN
INTRAMUSCULAR | Status: AC
  Filled 2015-08-02: qty 1

## 2015-08-02 MED ORDER — SODIUM CHLORIDE 0.9 % IJ SOLN
3.0000 mL | Freq: Two times a day (BID) | INTRAMUSCULAR | Status: DC
  Administered 2015-08-02: 3 mL via INTRAVENOUS

## 2015-08-02 MED ORDER — THROMBIN 20000 UNITS EX SOLR
CUTANEOUS | Status: AC
  Filled 2015-08-02: qty 20000

## 2015-08-02 MED ORDER — BUPIVACAINE-EPINEPHRINE (PF) 0.25% -1:200000 IJ SOLN
INTRAMUSCULAR | Status: AC
  Filled 2015-08-02: qty 30

## 2015-08-02 MED ORDER — FENTANYL CITRATE (PF) 250 MCG/5ML IJ SOLN
INTRAMUSCULAR | Status: AC
  Filled 2015-08-02: qty 5

## 2015-08-02 MED ORDER — ACETAMINOPHEN 10 MG/ML IV SOLN: 1000.0000 mg | Freq: Four times a day (QID) | INTRAVENOUS | Status: DC

## 2015-08-02 MED ORDER — OXYCODONE-ACETAMINOPHEN 10-325 MG PO TABS: 1.0000 | ORAL_TABLET | ORAL | Status: AC | PRN

## 2015-08-02 MED ORDER — LIDOCAINE HCL (CARDIAC) 20 MG/ML IV SOLN
INTRAVENOUS | Status: DC | PRN
  Administered 2015-08-02: 50 mg via INTRAVENOUS

## 2015-08-02 MED ORDER — ONDANSETRON HCL 4 MG/2ML IJ SOLN
INTRAMUSCULAR | Status: DC | PRN
  Administered 2015-08-02: 4 mg via INTRAVENOUS

## 2015-08-02 MED ORDER — EPHEDRINE SULFATE 50 MG/ML IJ SOLN
INTRAMUSCULAR | Status: AC
  Filled 2015-08-02: qty 1

## 2015-08-02 MED ORDER — OXYCODONE HCL 5 MG PO TABS
ORAL_TABLET | ORAL | Status: AC
  Filled 2015-08-02: qty 2

## 2015-08-02 MED ORDER — MIDAZOLAM HCL 5 MG/5ML IJ SOLN
INTRAMUSCULAR | Status: DC | PRN
  Administered 2015-08-02: 2 mg via INTRAVENOUS

## 2015-08-02 MED ORDER — GLYCOPYRROLATE 0.2 MG/ML IJ SOLN
INTRAMUSCULAR | Status: AC
  Filled 2015-08-02: qty 3

## 2015-08-02 MED ORDER — LACTATED RINGERS IV SOLN
INTRAVENOUS | Status: DC | PRN
  Administered 2015-08-02 (×3): via INTRAVENOUS

## 2015-08-02 MED ORDER — MENTHOL 3 MG MT LOZG: 1.0000 | LOZENGE | OROMUCOSAL | Status: DC | PRN

## 2015-08-02 MED ORDER — DEXAMETHASONE 4 MG PO TABS
4.0000 mg | ORAL_TABLET | Freq: Four times a day (QID) | ORAL | Status: DC
  Administered 2015-08-02: 4 mg via ORAL
  Filled 2015-08-02: qty 1

## 2015-08-02 MED ORDER — PROPOFOL 10 MG/ML IV BOLUS
INTRAVENOUS | Status: AC
  Filled 2015-08-02: qty 20

## 2015-08-02 MED ORDER — MUPIROCIN 2 % EX OINT
1.0000 "application " | TOPICAL_OINTMENT | Freq: Once | CUTANEOUS | Status: AC
  Administered 2015-08-02: 1 via TOPICAL

## 2015-08-02 MED ORDER — MORPHINE SULFATE (PF) 2 MG/ML IV SOLN: 1.0000 mg | INTRAVENOUS | Status: DC | PRN

## 2015-08-02 MED ORDER — ALBUMIN HUMAN 5 % IV SOLN
INTRAVENOUS | Status: DC | PRN
  Administered 2015-08-02: 08:00:00 via INTRAVENOUS

## 2015-08-02 MED ORDER — GLYCOPYRROLATE 0.2 MG/ML IJ SOLN
INTRAMUSCULAR | Status: AC
  Filled 2015-08-02: qty 1

## 2015-08-02 MED ORDER — ACETAMINOPHEN 10 MG/ML IV SOLN
INTRAVENOUS | Status: AC
  Filled 2015-08-02: qty 100

## 2015-08-02 MED ORDER — DEXAMETHASONE SODIUM PHOSPHATE 4 MG/ML IJ SOLN: 4.0000 mg | Freq: Four times a day (QID) | INTRAMUSCULAR | Status: DC

## 2015-08-02 MED ORDER — OXYCODONE HCL 5 MG PO TABS
10.0000 mg | ORAL_TABLET | ORAL | Status: DC | PRN
  Administered 2015-08-02 (×2): 10 mg via ORAL
  Filled 2015-08-02: qty 2

## 2015-08-02 MED ORDER — METHOCARBAMOL 500 MG PO TABS: 500.0000 mg | ORAL_TABLET | Freq: Three times a day (TID) | ORAL | Status: AC | PRN

## 2015-08-02 MED ORDER — NEOSTIGMINE METHYLSULFATE 10 MG/10ML IV SOLN
INTRAVENOUS | Status: DC | PRN
  Administered 2015-08-02: 5 mg via INTRAVENOUS

## 2015-08-02 MED ORDER — THROMBIN 20000 UNITS EX SOLR
CUTANEOUS | Status: DC | PRN
  Administered 2015-08-02: 20 mL via TOPICAL

## 2015-08-02 MED ORDER — CEFAZOLIN SODIUM-DEXTROSE 2-3 GM-% IV SOLR
INTRAVENOUS | Status: AC
  Filled 2015-08-02: qty 50

## 2015-08-02 MED ORDER — MUPIROCIN 2 % EX OINT
TOPICAL_OINTMENT | CUTANEOUS | Status: AC
  Filled 2015-08-02: qty 22

## 2015-08-02 MED ORDER — LAMOTRIGINE 25 MG PO TABS
25.0000 mg | ORAL_TABLET | Freq: Three times a day (TID) | ORAL | Status: DC
  Filled 2015-08-02 (×2): qty 1

## 2015-08-02 MED ORDER — PROMETHAZINE HCL 25 MG/ML IJ SOLN: 6.2500 mg | INTRAMUSCULAR | Status: DC | PRN

## 2015-08-02 MED ORDER — SODIUM CHLORIDE 0.9 % IJ SOLN
3.0000 mL | INTRAMUSCULAR | Status: DC | PRN
Start: 2015-08-02 — End: 2015-08-02

## 2015-08-02 MED ORDER — PROPOFOL 10 MG/ML IV BOLUS
INTRAVENOUS | Status: DC | PRN
  Administered 2015-08-02: 200 mg via INTRAVENOUS

## 2015-08-02 MED ORDER — PHENOL 1.4 % MT LIQD: 1.0000 | OROMUCOSAL | Status: DC | PRN

## 2015-08-02 MED ORDER — LACTATED RINGERS IV SOLN: INTRAVENOUS | Status: DC

## 2015-08-02 MED ORDER — HEMOSTATIC AGENTS (NO CHARGE) OPTIME
TOPICAL | Status: DC | PRN
  Administered 2015-08-02: 1 via TOPICAL

## 2015-08-02 MED ORDER — 0.9 % SODIUM CHLORIDE (POUR BTL) OPTIME
TOPICAL | Status: DC | PRN
  Administered 2015-08-02: 2000 mL

## 2015-08-02 MED ORDER — PHENYLEPHRINE HCL 10 MG/ML IJ SOLN
INTRAMUSCULAR | Status: DC | PRN
  Administered 2015-08-02: 40 ug via INTRAVENOUS

## 2015-08-02 MED ORDER — FENTANYL CITRATE (PF) 100 MCG/2ML IJ SOLN
INTRAMUSCULAR | Status: DC | PRN
  Administered 2015-08-02 (×3): 50 ug via INTRAVENOUS
  Administered 2015-08-02: 100 ug via INTRAVENOUS

## 2015-08-02 MED ORDER — ONDANSETRON HCL 4 MG PO TABS: 4.0000 mg | ORAL_TABLET | Freq: Three times a day (TID) | ORAL | Status: AC | PRN

## 2015-08-02 MED ORDER — ROCURONIUM BROMIDE 100 MG/10ML IV SOLN
INTRAVENOUS | Status: DC | PRN
  Administered 2015-08-02: 50 mg via INTRAVENOUS

## 2015-08-02 MED ORDER — DEXAMETHASONE SODIUM PHOSPHATE 4 MG/ML IJ SOLN
INTRAMUSCULAR | Status: DC | PRN
  Administered 2015-08-02: 8 mg via INTRAVENOUS

## 2015-08-02 MED ORDER — ACETAMINOPHEN 500 MG PO TABS
1000.0000 mg | ORAL_TABLET | Freq: Four times a day (QID) | ORAL | Status: DC
  Administered 2015-08-02: 1000 mg via ORAL
  Filled 2015-08-02: qty 2

## 2015-08-02 MED ORDER — ONDANSETRON HCL 4 MG/2ML IJ SOLN
INTRAMUSCULAR | Status: AC
  Filled 2015-08-02: qty 2

## 2015-08-02 MED ORDER — NEOSTIGMINE METHYLSULFATE 10 MG/10ML IV SOLN
INTRAVENOUS | Status: AC
  Filled 2015-08-02: qty 1

## 2015-08-02 MED ORDER — ROCURONIUM BROMIDE 50 MG/5ML IV SOLN
INTRAVENOUS | Status: AC
  Filled 2015-08-02: qty 1

## 2015-08-02 SURGICAL SUPPLY — 60 items
14MM DISTRACTION SCREW ×2 IMPLANT
BLADE SURG ROTATE 9660 (MISCELLANEOUS) ×2 IMPLANT
BUR EGG ELITE 4.0 (BURR) IMPLANT
BUR EGG ELITE 4.0MM (BURR)
BUR MATCHSTICK NEURO 3.0 LAGG (BURR) IMPLANT
CANISTER SUCTION 2500CC (MISCELLANEOUS) ×3 IMPLANT
CLOSURE STERI-STRIP 1/2X4 (GAUZE/BANDAGES/DRESSINGS) ×1
CLOSURE WOUND 1/2 X4 (GAUZE/BANDAGES/DRESSINGS) ×1
CLSR STERI-STRIP ANTIMIC 1/2X4 (GAUZE/BANDAGES/DRESSINGS) ×2 IMPLANT
COLLAR CERV LO CONTOUR FIRM DE (SOFTGOODS) ×2 IMPLANT
CORDS BIPOLAR (ELECTRODE) ×3 IMPLANT
COVER MAYO STAND STRL (DRAPES) ×6 IMPLANT
COVER SURGICAL LIGHT HANDLE (MISCELLANEOUS) ×6 IMPLANT
CRADLE DONUT ADULT HEAD (MISCELLANEOUS) ×3 IMPLANT
DISC MOBI-C CERVICAL 15X17 H5 (Miscellaneous) ×2 IMPLANT
DRAPE C-ARM 42X72 X-RAY (DRAPES) ×3 IMPLANT
DRAPE POUCH INSTRU U-SHP 10X18 (DRAPES) ×3 IMPLANT
DRAPE SURG 17X23 STRL (DRAPES) ×3 IMPLANT
DRAPE U-SHAPE 47X51 STRL (DRAPES) ×3 IMPLANT
DRSG MEPILEX BORDER 4X4 (GAUZE/BANDAGES/DRESSINGS) ×3 IMPLANT
DURAPREP 26ML APPLICATOR (WOUND CARE) ×3 IMPLANT
ELECT COATED BLADE 2.86 ST (ELECTRODE) ×3 IMPLANT
ELECT PENCIL ROCKER SW 15FT (MISCELLANEOUS) ×3 IMPLANT
ELECT REM PT RETURN 9FT ADLT (ELECTROSURGICAL) ×3
ELECTRODE REM PT RTRN 9FT ADLT (ELECTROSURGICAL) ×1 IMPLANT
GLOVE BIOGEL PI IND STRL 8 (GLOVE) ×1 IMPLANT
GLOVE BIOGEL PI IND STRL 8.5 (GLOVE) ×1 IMPLANT
GLOVE BIOGEL PI INDICATOR 8 (GLOVE) ×2
GLOVE BIOGEL PI INDICATOR 8.5 (GLOVE) ×2
GLOVE SS BIOGEL STRL SZ 8.5 (GLOVE) ×1 IMPLANT
GLOVE SUPERSENSE BIOGEL SZ 8.5 (GLOVE) ×2
GOWN STRL REUS W/ TWL LRG LVL3 (GOWN DISPOSABLE) ×1 IMPLANT
GOWN STRL REUS W/TWL 2XL LVL3 (GOWN DISPOSABLE) ×6 IMPLANT
GOWN STRL REUS W/TWL LRG LVL3 (GOWN DISPOSABLE) ×3
ILLUMINATOR WAVEGUIDE N/F (MISCELLANEOUS) ×4 IMPLANT
KIT BASIN OR (CUSTOM PROCEDURE TRAY) ×3 IMPLANT
KIT ROOM TURNOVER OR (KITS) ×3 IMPLANT
NDL SPNL 18GX3.5 QUINCKE PK (NEEDLE) ×1 IMPLANT
NEEDLE SPNL 18GX3.5 QUINCKE PK (NEEDLE) ×3 IMPLANT
NS IRRIG 1000ML POUR BTL (IV SOLUTION) ×3 IMPLANT
PACK ORTHO CERVICAL (CUSTOM PROCEDURE TRAY) ×3 IMPLANT
PACK UNIVERSAL I (CUSTOM PROCEDURE TRAY) ×3 IMPLANT
PAD ARMBOARD 7.5X6 YLW CONV (MISCELLANEOUS) ×6 IMPLANT
RESTRAINT LIMB HOLDER UNIV (RESTRAINTS) ×3 IMPLANT
SPONGE INTESTINAL PEANUT (DISPOSABLE) ×2 IMPLANT
SPONGE SURGIFOAM ABS GEL 100 (HEMOSTASIS) ×3 IMPLANT
STRIP CLOSURE SKIN 1/2X4 (GAUZE/BANDAGES/DRESSINGS) ×1 IMPLANT
SURGIFLO W/THROMBIN 8M KIT (HEMOSTASIS) ×3 IMPLANT
SUT BONE WAX W31G (SUTURE) ×3 IMPLANT
SUT MNCRL AB 3-0 PS2 27 (SUTURE) ×3 IMPLANT
SUT SILK 3 0 TIES 17X18 (SUTURE) ×3
SUT SILK 3-0 18XBRD TIE BLK (SUTURE) ×1 IMPLANT
SUT VIC AB 2-0 CT1 18 (SUTURE) ×3 IMPLANT
SYR BULB IRRIGATION 50ML (SYRINGE) ×3 IMPLANT
SYR CONTROL 10ML LL (SYRINGE) IMPLANT
TAPE CLOTH 4X10 WHT NS (GAUZE/BANDAGES/DRESSINGS) ×3 IMPLANT
TAPE UMBILICAL COTTON 1/8X30 (MISCELLANEOUS) ×3 IMPLANT
TOWEL OR 17X24 6PK STRL BLUE (TOWEL DISPOSABLE) ×3 IMPLANT
TOWEL OR 17X26 10 PK STRL BLUE (TOWEL DISPOSABLE) ×3 IMPLANT
WATER STERILE IRR 1000ML POUR (IV SOLUTION) ×3 IMPLANT

## 2015-08-02 NOTE — Anesthesia Postprocedure Evaluation (Signed)
  Anesthesia Post-op Note  Patient: Scott Christian  Procedure(s) Performed: Procedure(s): Cervical 5-Cervical 6 TOTAL DISC  ARTHROPLASTY (N/A)  Patient Location: PACU  Anesthesia Type:General  Level of Consciousness: awake  Airway and Oxygen Therapy: Patient Spontanous Breathing  Post-op Pain: mild  Post-op Assessment: Post-op Vital signs reviewed              Post-op Vital Signs: Reviewed  Last Vitals:  Filed Vitals:   08/02/15 1326  BP: 133/70  Pulse: 76  Temp: 37.1 C  Resp: 20    Complications: No apparent anesthesia complications

## 2015-08-02 NOTE — Transfer of Care (Signed)
Immediate Anesthesia Transfer of Care Note  Patient: Scott Christian  Procedure(s) Performed: Procedure(s): Cervical 5-Cervical 6 TOTAL DISC  ARTHROPLASTY (N/A)  Patient Location: PACU  Anesthesia Type:General  Level of Consciousness: awake, alert , oriented, patient cooperative and responds to stimulation  Airway & Oxygen Therapy: Patient Spontanous Breathing and Patient connected to nasal cannula oxygen  Post-op Assessment: Report given to RN, Post -op Vital signs reviewed and stable, Patient moving all extremities X 4 and Patient able to stick tongue midline  Post vital signs: Reviewed and stable  Last Vitals:  Filed Vitals:   08/02/15 1033  BP:   Pulse:   Temp: 36.7 C  Resp:     Complications: No apparent anesthesia complications

## 2015-08-02 NOTE — Progress Notes (Signed)
Orthopedic Tech Progress Note Patient Details:  Scott Christian [birth year] [id number]  Patient ID: Scott Christian, male   DOB: [birth year], 34 y.o.   MRN: [id number] Called in bio-tech brace order; spoke with Scott Christian, Scott Christian 08/02/2015, 2:08 PM

## 2015-08-02 NOTE — Progress Notes (Signed)
SCHEDULED IV ACETAMINOPHEN:  CONVERSION TO ORAL ROUTE to complete the ordered doses.  The Pharmacy and Therapeutics Committee has restricted administration of IV acetaminophen (with a 24 hr maximum duration) to patients who meet both of the following criteria:  Unable to tolerate oral or enteral medication  Contraindication to NSAIDs  Because the patient has taken other oral medications (oxycodone IR) today, IV acetaminophen has been converted to PO to complete the course of therapy originally ordered.  If PO acetaminophen should be continued beyond the original stop time, please adjust the order accordingly using the "modify" function.   If you have questions about this conversion, please contact the pharmacy department.  Brigid Re, [facility] 08/02/2015 2:18 PM

## 2015-08-02 NOTE — Brief Op Note (Signed)
08/02/2015  10:10 AM  PATIENT:  Scott Christian  34 y.o. 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   PROCEDURE:  Procedure(s): Cervical 5-Cervical 6 TOTAL DISC  ARTHROPLASTY (N/A)  SURGEON:  Surgeon(s) and Role:    * Venita Lick, MD - Primary  PHYSICIAN ASSISTANT:   ASSISTANTS: carmen mayo   ANESTHESIA:   general  EBL:  Total I/O In: 2250 [I.V.:2000; IV Piggyback:250] Out: 25 [Blood:25]  BLOOD ADMINISTERED:none  DRAINS: none   LOCAL MEDICATIONS USED:  MARCAINE     SPECIMEN:  No Specimen  DISPOSITION OF SPECIMEN:  N/A  COUNTS:  YES  TOURNIQUET:  * No tourniquets in log *  DICTATION: .Other Dictation: Dictation Number [id number]  PLAN OF CARE: Admit for overnight observation  PATIENT DISPOSITION:  PACU - hemodynamically stable.

## 2015-08-02 NOTE — Progress Notes (Signed)
Patient alert and oriented, mae's well, voiding adequate amount of urine, swallowing without difficulty, no c/o pain. Patient discharged home with family. Script and discharged instructions given to patient. Patient and family stated understanding of d/c instructions given and has an appointment with MD. 

## 2015-08-02 NOTE — Anesthesia Preprocedure Evaluation (Addendum)
Anesthesia Evaluation  Patient identified by MRN, date of birth, ID band Patient awake    Reviewed: Allergy & Precautions, NPO status , Patient's Chart, lab work & pertinent test results  Airway Mallampati: II  TM Distance: >3 FB Neck ROM: Full    Dental   Pulmonary Current Smoker,    breath sounds clear to auscultation       Cardiovascular negative cardio ROS   Rhythm:Regular     Neuro/Psych    GI/Hepatic negative GI ROS, Neg liver ROS,   Endo/Other  negative endocrine ROS  Renal/GU negative Renal ROS     Musculoskeletal   Abdominal   Peds  Hematology negative hematology ROS (+)   Anesthesia Other Findings   Reproductive/Obstetrics                            Anesthesia Physical Anesthesia Plan  ASA: III  Anesthesia Plan: General   Post-op Pain Management:    Induction: Intravenous  Airway Management Planned: Oral ETT  Additional Equipment:   Intra-op Plan:   Post-operative Plan: Extubation in OR  Informed Consent: I have reviewed the patients History and Physical, chart, labs and discussed the procedure including the risks, benefits and alternatives for the proposed anesthesia with the patient or authorized representative who has indicated his/her understanding and acceptance.   Dental advisory given  Plan Discussed with: CRNA and Anesthesiologist  Anesthesia Plan Comments:         Anesthesia Quick Evaluation

## 2015-08-02 NOTE — Progress Notes (Signed)
Called by nsg - patient requesting d/c to home Positive spontaneous void Neuro intact Minimal pain xrays - satisfactory Ok for d/c to home. F/u in 2 weeks
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
## 2015-08-02 NOTE — Discharge Instructions (Signed)

## 2015-08-02 NOTE — Evaluation (Signed)
Physical Therapy Evaluation and Discharge Patient Details Name: Scott Christian MRN: [id number] DOB: Apr 30, 1981 Today's Date: 08/02/2015   History of Present Illness  34 y.o. male s/p Cervical 5-Cervical 6 TOTAL DISC ARTHROPLASTY   Clinical Impression  Patient evaluated by Physical Therapy with no further acute PT needs identified. All education has been completed and the patient has no further questions. Ambulates well, tolerating higher level dynamic challenges without loss of balance and safely navigates stairs. Reviewed safety with mobility including transfers and with ADLs. Good support from significant other who plans to assist patient as needed. See below for any follow-up Physial Therapy or equipment needs. PT is signing off. Thank you for this referral.     Follow Up Recommendations No PT follow up    Equipment Recommendations  None recommended by PT    Recommendations for Other Services       Precautions / Restrictions Precautions Precautions: Cervical (no specific orders, educated for comfort) Required Braces or Orthoses: Cervical Brace Cervical Brace: Hard collar Restrictions Weight Bearing Restrictions: No      Mobility  Bed Mobility Overal bed mobility: Needs Assistance Bed Mobility: Rolling;Sidelying to Sit;Sit to Sidelying Rolling: Supervision Sidelying to sit: Supervision     Sit to sidelying: Supervision General bed mobility comments: Educated on log roll technique. no assist needed. Performs safely  Transfers Overall transfer level: Needs assistance Equipment used: None Transfers: Sit to/from Stand Sit to Stand: Supervision         General transfer comment: Supervision for safety. No loss of balance noted.  Ambulation/Gait Ambulation/Gait assistance: Independent Ambulation Distance (Feet): 210 Feet Assistive device: None Gait Pattern/deviations: WFL(Within Functional Limits)   Gait velocity interpretation: at or above normal speed for  age/gender General Gait Details: No loss of balance with dynamic gait challenges including high marching, quick turns, variable speeds, and backwards stepping.   Stairs Stairs: Yes Stairs assistance: Modified independent (Device/Increase time) Stair Management: One rail Left;Alternating pattern;Forwards Number of Stairs: 3 General stair comments: No assist needed to safely traverse steps with rails. Demos good strength and control with ascent/descent  Wheelchair Mobility    Modified Rankin (Stroke Patients Only)       Balance Overall balance assessment: Independent                                           Pertinent Vitals/Pain Pain Assessment: Faces Faces Pain Scale: Hurts little more Pain Location: neck Pain Descriptors / Indicators: Aching Pain Intervention(s): Monitored during session;Repositioned    Home Living Family/patient expects to be discharged to:: Private residence Living Arrangements: Spouse/significant other Available Help at Discharge: Family;Available PRN/intermittently Type of Home: House Home Access: Stairs to enter Entrance Stairs-Rails: Doctor, general practice of Steps: 2 Home Layout: One level Home Equipment: None      Prior Function Level of Independence: Independent               Hand Dominance   Dominant Hand: Right    Extremity/Trunk Assessment   Upper Extremity Assessment: Defer to OT evaluation           Lower Extremity Assessment: Overall WFL for tasks assessed      Cervical / Trunk Assessment: Other exceptions  Communication   Communication: No difficulties  Cognition Arousal/Alertness: Awake/alert Behavior During Therapy: WFL for tasks assessed/performed Overall Cognitive Status: Within Functional Limits for tasks assessed  General Comments General comments (skin integrity, edema, etc.): Reviewed use of collar with patient and significant other. Discussed  various options to don/doff clothing.    Exercises        Assessment/Plan    PT Assessment Patent does not need any further PT services  PT Diagnosis Acute pain   PT Problem List    PT Treatment Interventions     PT Goals (Current goals can be found in the Care Plan section) Acute Rehab PT Goals Patient Stated Goal: Go home today PT Goal Formulation: All assessment and education complete, DC therapy    Frequency     Barriers to discharge        Co-evaluation               End of Session Equipment Utilized During Treatment: Cervical collar Activity Tolerance: Patient tolerated treatment well Patient left: in bed;with call bell/phone within reach;with family/visitor present Nurse Communication: Mobility status    Functional Assessment Tool Used: Clinical Observation Functional Limitation: Mobility: Walking and moving around Mobility: Walking and Moving Around Current Status (K9381): At least 1 percent but less than 20 percent impaired, limited or restricted Mobility: Walking and Moving Around Goal Status [phone]): At least 1 percent but less than 20 percent impaired, limited or restricted Mobility: Walking and Moving Around Discharge Status [phone]): At least 1 percent but less than 20 percent impaired, limited or restricted    Time: 1630-1640 PT Time Calculation (min) (ACUTE ONLY): 10 min   Charges:   PT Evaluation $Initial PT Evaluation Tier I: 1 Procedure     PT G Codes:   PT G-Codes **NOT FOR INPATIENT CLASS** Functional Assessment Tool Used: Clinical Observation Functional Limitation: Mobility: Walking and moving around Mobility: Walking and Moving Around Current Status (V8938): At least 1 percent but less than 20 percent impaired, limited or restricted Mobility: Walking and Moving Around Goal Status [phone]): At least 1 percent but less than 20 percent impaired, limited or restricted Mobility: Walking and Moving Around Discharge Status [phone]): At least 1  percent but less than 20 percent impaired, limited or restricted    Berton Mount 08/02/2015, 5:11 PM [address], [place] [phone]

## 2015-08-02 NOTE — Plan of Care (Signed)
Problem: Consults Goal: Diagnosis - Spinal Surgery Outcome: Completed/Met Date Met:  08/02/15 C5-6 ARTHROPLASTY

## 2015-08-02 NOTE — Anesthesia Procedure Notes (Signed)
Procedure Name: Intubation Date/Time: 08/02/2015 7:43 AM Performed by: Marylyn Ishihara Pre-anesthesia Checklist: Patient identified, Timeout performed, Emergency Drugs available, Suction available and Patient being monitored Patient Re-evaluated:Patient Re-evaluated prior to inductionOxygen Delivery Method: Circle system utilized Preoxygenation: Pre-oxygenation with 100% oxygen Intubation Type: IV induction Ventilation: Mask ventilation without difficulty Laryngoscope Size: Mac and 4 Grade View: Grade I Tube size: 7.5 mm Number of attempts: 1 Airway Equipment and Method: Stylet Placement Confirmation: ETT inserted through vocal cords under direct vision,  breath sounds checked- equal and bilateral and positive ETCO2 Secured at: 22 cm Tube secured with: Tape Dental Injury: Teeth and Oropharynx as per pre-operative assessment

## 2015-08-02 NOTE — Progress Notes (Signed)
Orthopedic Tech Progress Note Patient Details:  [name] 12/26/1980 [id number] Brace order completed by bio-tech vendor. Patient ID: KAHLEL Christian, male   DOB: 27-Oct-1981, 34 y.o.   MRN: [id number]   Jennye Moccasin 08/02/2015, 3:37 PM

## 2015-08-03 ENCOUNTER — Encounter (HOSPITAL_COMMUNITY): Payer: Self-pay | Admitting: Orthopedic Surgery

## 2015-08-03 NOTE — Op Note (Signed)
NAMEDARE, SANGER NO.:  [id number]  MEDICAL RECORD NO.:  [id number]  LOCATION:  3C08C                        FACILITY:  [facility]  PHYSICIAN:  Daurice Ovando D. Shon Baton, M.D. DATE OF BIRTH:  06/20/1981  DATE OF PROCEDURE:  08/02/2015 DATE OF DISCHARGE:  08/02/2015                              OPERATIVE REPORT   PREOPERATIVE DIAGNOSIS:  Cervical radiculopathy secondary to C5-6 disk herniation, left side.  POSTOPERATIVE DIAGNOSIS:  Cervical radiculopathy secondary to C5-6 disk herniation, left side.  OPERATIVE PROCEDURE:  Total disk arthroplasty, C5-C6.  Instrumentation system used was the Mobi-C disk 5 x 17.  COMPLICATIONS:  None.  CONDITION:  Stable.  FIRST ASSISTANT:  Anette Riedel, P.A.  HISTORY:  This is a very pleasant, young gentleman who complains of severe radicular left arm pain with motor and sensory changes.  MRI imaging confirmed C5-6 disk herniation with C6 neural compression. Attempts at conservative management have failed to alleviate his symptoms.  As a result, we elected to proceed with surgery.  All appropriate risks, benefits, and alternatives were discussed with the patient and consent was obtained.  DESCRIPTION OF PROCEDURE:  The patient was brought to the operating theater, placed supine on the operating table.  After successful induction of general anesthesia and endotracheal intubation, TEDs, SCDs were applied.  Inflatable pressure bag was placed underneath the scapula and the arms were tucked at the side and the anterior cervical spine was prepped and draped in a standard fashion.  Time-out was taken to confirm the patient, procedure, and all other pertinent important data.  Once this was done, lateral fluoroscopy was used to determine the C5-6 level. The incision site was then mapped out and infiltrated with 0.25% Marcaine.  An incision starting at the midline proceeding to the left was undertaken.  Sharp dissection was carried out down  to the platysma. The platysma was sharply incised and I began performing a standard Clementeen Graham approach to the anterior cervical spine.  I swept down sharply along the medial border of the sternocleidomastoid until I identified the omohyoid muscle.  I released this and was easily retractable, so I did not sacrifice.  I then bluntly dissected through the remaining deep cervical and prevertebral fascia down, so I could palpate the anterior longitudinal ligament.  I also palpated the carotid sheath.  I swept the esophagus to the right and placed a retractor to protect it.  I then protected the carotid sheath laterally with my finger and then used Kittner dissectors to remove the remaining prevertebral fascia to expose the C5-6 disk space in its entirety.  A needle was placed into the C5-6 disk space and a lateral x-ray was taken to confirm that I was at the appropriate level.  Once this was completed, I then mobilized the longus colli muscles at the level of the disk space at C5-6.  I then placed my narrow Caspar retracting blades underneath the longus colli muscle, deflated the endotracheal cuff, expanded the retractors, and reinflated the cuff.  I now had excellent visualization of the C5-6 disk space.  An annulotomy was performed with a 15 blade scalpel.  Then, using a combination of pituitary rongeurs, I removed the bulk of  the disk material.  Distraction pins were placed in the midportion of the vertebral bodies.  Care was taken to make sure they were in the midline. I used an AP x-ray to confirm that they were in the midline.  I then was able to distract the intervertebral space and maintained the distraction with the pins.  I continued using my neural curettes and pituitary rongeurs to remove the disk material.  Once I was at the posterior annulus, I identified the rent with a herniation that occurred and used my nerve hook to sweep out the 2 fragments of disk material.  Once  this was done, I used this defect to identify the posterior longitudinal ligament.  Then, using my fine nerve hook, developed a plane underneath the posterior longitudinal ligament.  Once I had my space between the thecal sac and PLL, I was able to use my 1 mm Kerrison to resect the PLL as well as the remaining portion of the annulus.  Once this was done, I was able to sweep underneath the endplates of C5 and C6, making sure that there was no further retained fragments of disk material.  I trimmed down the posterior osteophytes from the vertebral bodies of C5 and C6.  At this point, I then scraped the endplates to ensure I had removed all of the cartilage.  The anterior osteophytes from the inferior aspect of C5 were left in place per the technique for the Mobi- C implant.  I then trialed and elected to use the size 5 x 17 implant, this was obtained.  I made sure I had hemostasis using bipolar electrocautery and FloSeal.  I irrigated the wound copiously with normal saline and then inserted the disk each to the appropriate depth.  I malleted it down, so it was just 1 or 2 mm shy of the posterior margin of the vertebral body.  The dome was well seated into the concavity of C5.  I then removed the implanting device, compressed the distraction pins, and then removed the distraction pins.  I irrigated the wound copiously with normal saline, placed bone wax down the distraction pin holes to prevent bleeding.  I then returned the trachea and esophagus to midline.  I then used x-ray with the neck in flexion and extension to confirm that the disk itself was moving.  I also confirmed AP and lateral placement.  Satisfied with its position.  I then closed the platysma with interrupted 2-0 Vicryl sutures and then a 3-0 Monocryl for the skin.  Steri-Strips and dry dressing were ultimately applied.  The patient was then extubated and transferred to the PACU without incident. At the end of the case, all  needle and sponge counts were correct. There were no adverse intraoperative events.     Steffie Waggoner D. Shon Baton, M.D.     DDB/MEDQ  D:  08/02/2015  T:  08/03/2015  Job:  [id number]  cc:   Debria Garret D. Shon Baton, M.D.'s Office

## 2015-08-15 NOTE — Discharge Summary (Addendum)
Physician Discharge Summary  Patient ID: Scott Christian MRN: [id number] DOB/AGE: 1981-03-20 34 y.o.  Admit date: 08/02/2015 Discharge date: 08/02/15  Admission Diagnoses:  Cervical pain and Left Arm Radicular pain  Discharge Diagnoses:  Active Problems:   Neck pain   Past Medical History  Diagnosis Date  . Headache     occasionally  . Tingling     left side  . Arthritis     shoulder     Surgeries: Procedure(s): Cervical 5-Cervical 6 TOTAL DISC  ARTHROPLASTY on 08/02/2015   Consultants (if any):    Discharged Condition: Improved  Hospital Course: DJIBRIL GLOGOWSKI is an 34 y.o. male who was admitted 08/02/2015 with a diagnosis of cervical pain and left arm and went to the operating room on 08/02/2015 and underwent the above named procedures.  The pt was in PACU and transferred to Med Surg.  His hospital course was uneventful.  After being monitored post op the pts requested to be discharged.  The pt is young and has no other comorbid conditions.  The pt was discharged on 08/02/15.   He was given perioperative antibiotics:  Anti-infectives    Start     Dose/Rate Route Frequency Ordered Stop   08/02/15 1500  ceFAZolin (ANCEF) IVPB 1 g/50 mL premix  Status:  Discontinued     1 g 100 mL/hr over 30 Minutes Intravenous Every 8 hours 08/02/15 1329 08/02/15 2157   08/02/15 0555  ceFAZolin (ANCEF) IVPB 2 g/50 mL premix     2 g 100 mL/hr over 30 Minutes Intravenous 30 min pre-op 08/02/15 0555 08/02/15 0729   08/02/15 0532  ceFAZolin (ANCEF) 2-3 GM-% IVPB SOLR    Comments:  Scronce, Trina   : cabinet override      08/02/15 0532 08/02/15 1744    .  He was given sequential compression devices, early ambulation, and TED hose for DVT prophylaxis.  He benefited maximally from the hospital stay and there were no complications.    Recent vital signs:  Filed Vitals:   08/02/15 1612  BP: 136/91  Pulse: 75  Temp: 97.9 F (36.6 C)  Resp: 20    Recent laboratory studies:  Lab Results   Component Value Date   HGB 14.8 08/02/2015   HGB 14.2 08/31/2012   HGB 15.3 08/30/2012   Lab Results  Component Value Date   WBC 6.4 08/02/2015   PLT 216 08/02/2015   Lab Results  Component Value Date   INR 1.12 08/02/2015   Lab Results  Component Value Date   NA 139 08/02/2015   K 3.8 08/02/2015   CL 104 08/02/2015   CO2 28 08/02/2015   BUN 14 08/02/2015   CREATININE 0.89 08/02/2015   GLUCOSE 93 08/02/2015    Discharge Medications:     Medication List    STOP taking these medications        HYDROcodone-acetaminophen 5-325 MG per tablet  Commonly known as:  NORCO/VICODIN     ibuprofen 400 MG tablet  Commonly known as:  ADVIL,MOTRIN     multivitamin with minerals tablet      TAKE these medications        lamoTRIgine 25 MG tablet  Commonly known as:  LAMICTAL  Take 25 mg by mouth 3 (three) times daily.     methocarbamol 500 MG tablet  Commonly known as:  ROBAXIN  Take 1 tablet (500 mg total) by mouth 3 (three) times daily as needed for muscle spasms.     ondansetron  4 MG tablet  Commonly known as:  ZOFRAN  Take 1 tablet (4 mg total) by mouth every 8 (eight) hours as needed for nausea or vomiting.     oxyCODONE-acetaminophen 10-325 MG per tablet  Commonly known as:  PERCOCET  Take 1 tablet by mouth every 4 (four) hours as needed for pain.        Diagnostic Studies: Dg Cervical Spine 2 Or 3 Views  08/02/2015   CLINICAL DATA:  Postop spinal fusion  EXAM: CERVICAL SPINE - 2-3 VIEW  COMPARISON:  Operative images 9 06/2015  FINDINGS: Disc prosthesis at C5-6 in satisfactory position and alignment. Normal cervical alignment. Negative for fracture or mass. Minimal disc degeneration at C4-5 and C6-7.  Soft tissue edema and gas is present in the anterior neck due to recent surgery.  IMPRESSION: Satisfactory position alignment of disc prosthesis at C5-6.   Electronically Signed   By: Marlan Palau M.D.   On: 08/02/2015 14:35   Dg Cervical Spine Complete  08/02/2015    CLINICAL DATA:  C5-6 disc arthroplasty  EXAM: CERVICAL SPINE  4+ VIEWS  COMPARISON:  None.  FINDINGS: Six C-arm spot films were returned. The initial film show screws placed in C5 and C6 with interbody metallic device in good position at C5-6. Normal alignment is maintained. There appear to be C-arm lateral views with slight flexion and slight extension showing good position of the C5-6 arthroplasty device.  IMPRESSION: C5-6 disc arthroplasty in good position.   Electronically Signed   By: Dwyane Dee M.D.   On: 08/02/2015 10:18   Dg C-arm Gt 120 Min  08/02/2015   CLINICAL DATA:  C5-6 disc arthroplasty  EXAM: DG C-ARM GT 120 MIN  FLUOROSCOPY TIME:  Radiation Exposure Index (as provided by the fluoroscopic device):  If the device does not provide the exposure index:  Fluoroscopy Time (in minutes and seconds):  1 minutes 3 seconds  Number of Acquired Images:  6  COMPARISON:  None.  FINDINGS: C-arm fluoroscopy was provided for C5-6 disc arthroplasty.  IMPRESSION: C-arm fluoroscopy provided   Electronically Signed   By: Dwyane Dee M.D.   On: 08/02/2015 10:16    Disposition: 01-Home or Self Care        Follow-up Information    Follow up with Alvy Beal, MD. Schedule an appointment as soon as possible for a visit in 2 weeks.   Specialty:  Orthopedic Surgery   Why:  If symptoms worsen, For suture removal, For wound re-check   Contact information:   [address] [place] [address] [phone]        Signed: Kirt Boys 08/15/2015, 4:54 PM

## 2015-08-21 ENCOUNTER — Encounter (HOSPITAL_COMMUNITY): Payer: Self-pay | Admitting: Orthopedic Surgery

## 2017-05-15 IMAGING — RF DG CERVICAL SPINE COMPLETE 4+V
1 series · 6 of 6 positions shown · non-contrast
Comparison: None.

CLINICAL DATA: C5-6 disc arthroplasty

EXAM:
CERVICAL SPINE  4+ VIEWS

[Series 1: run · 6 of 6 slices shown]
[im 1/6]
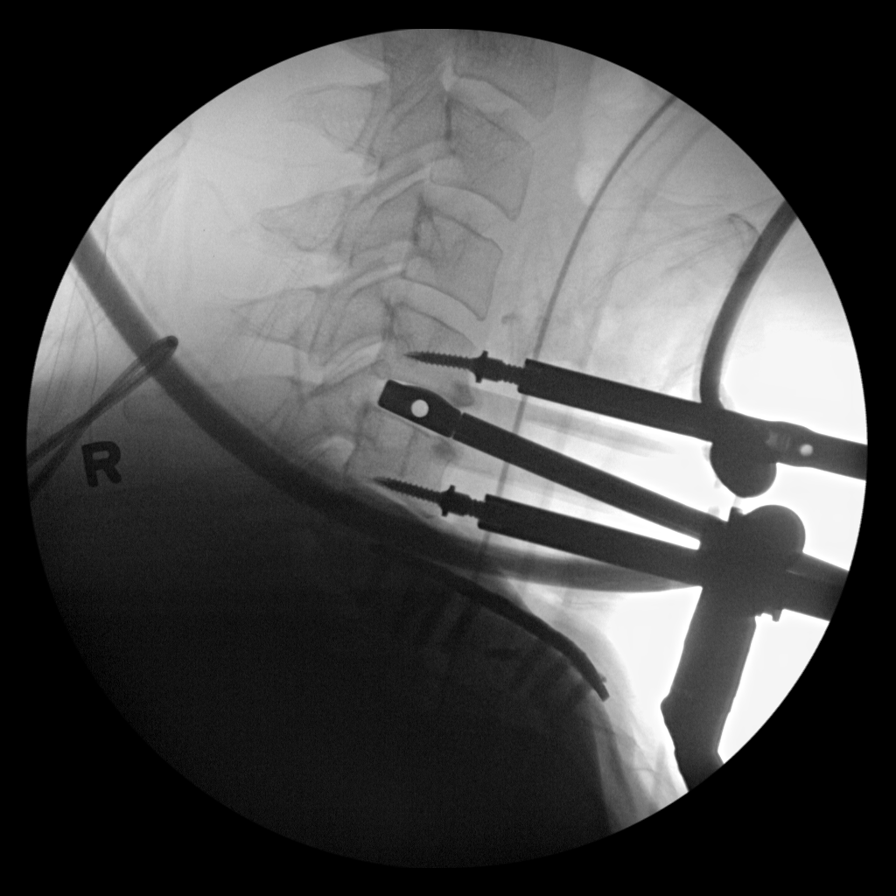
[im 2/6]
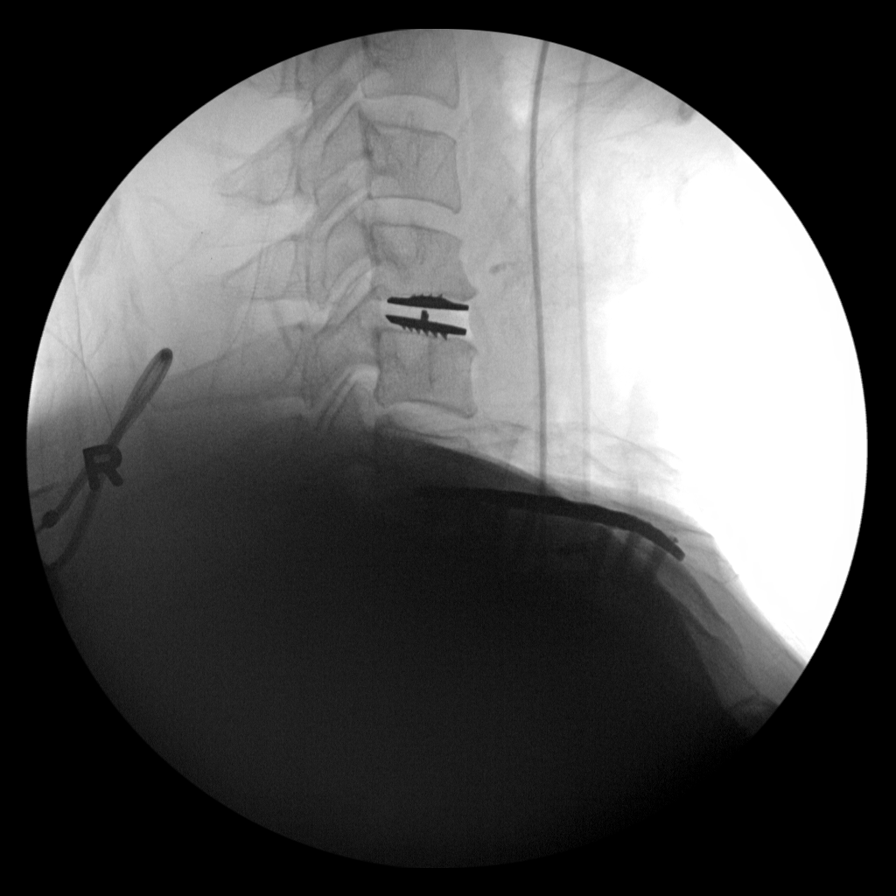
[im 3/6]
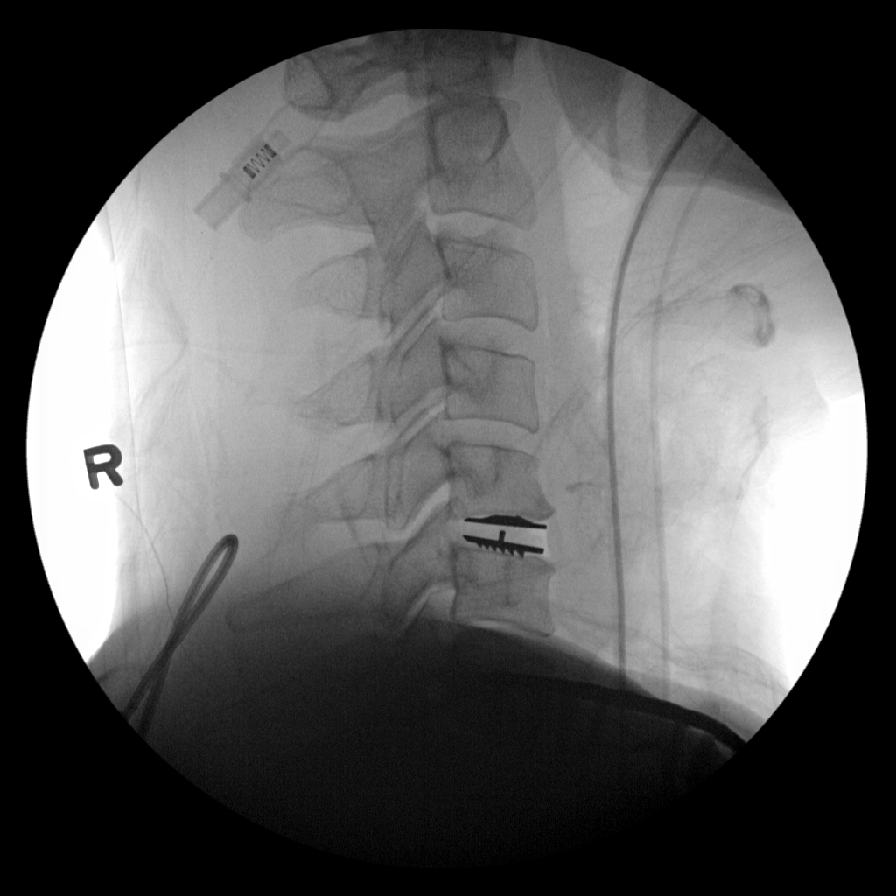
[im 4/6]
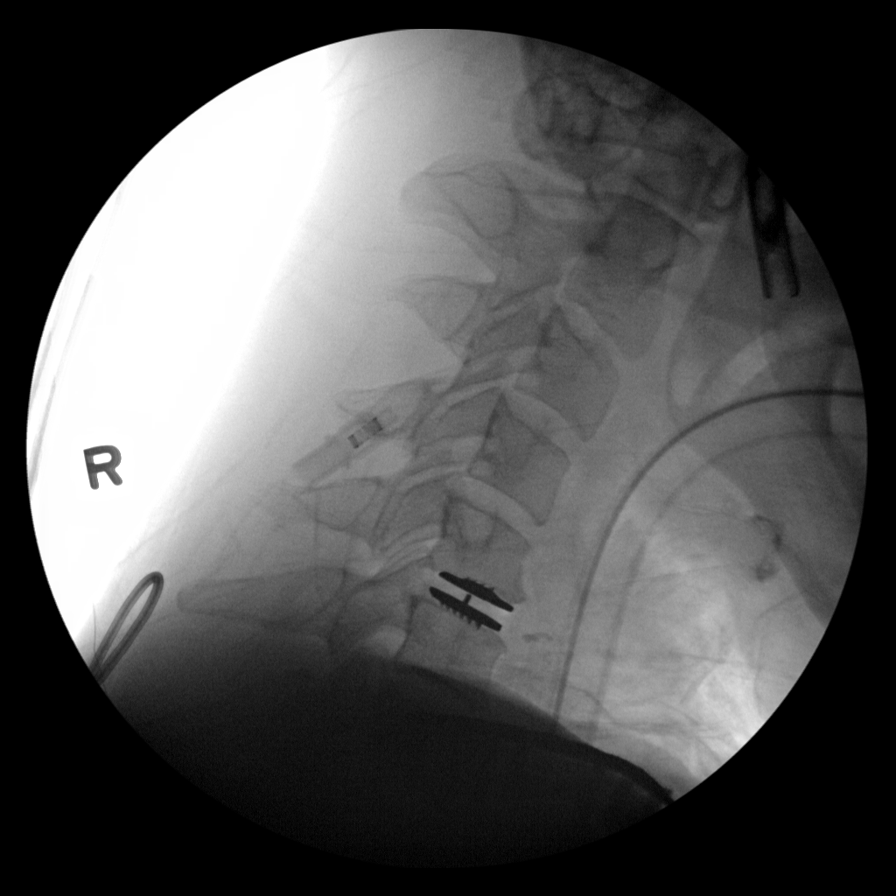
[im 5/6]
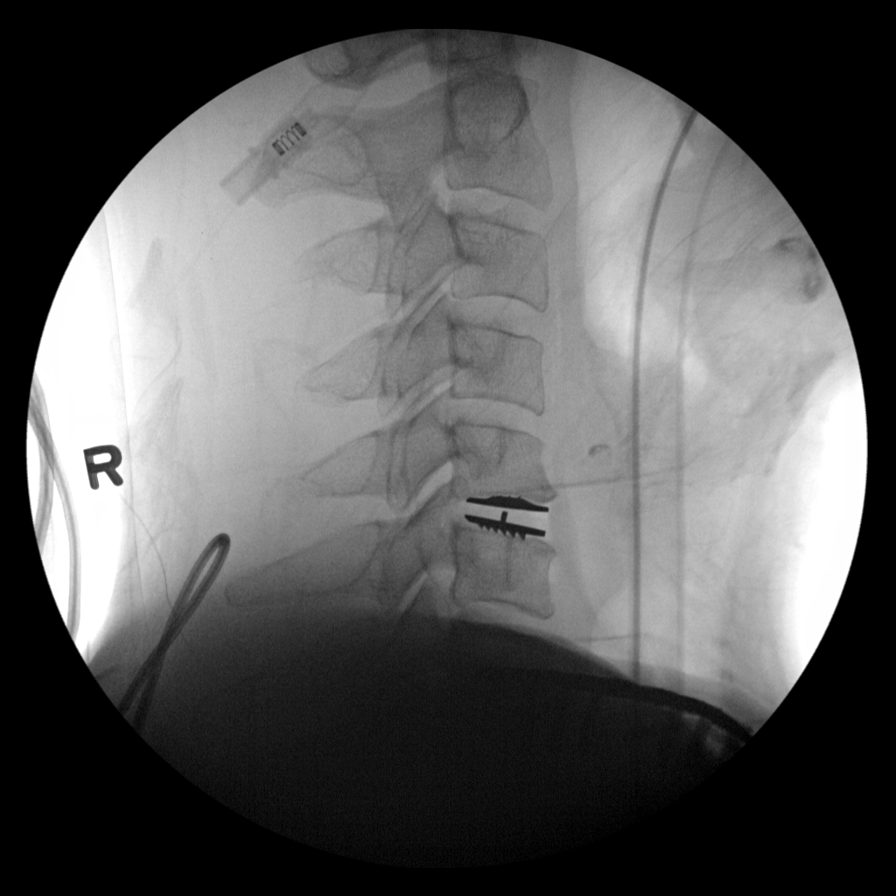
[im 6/6]
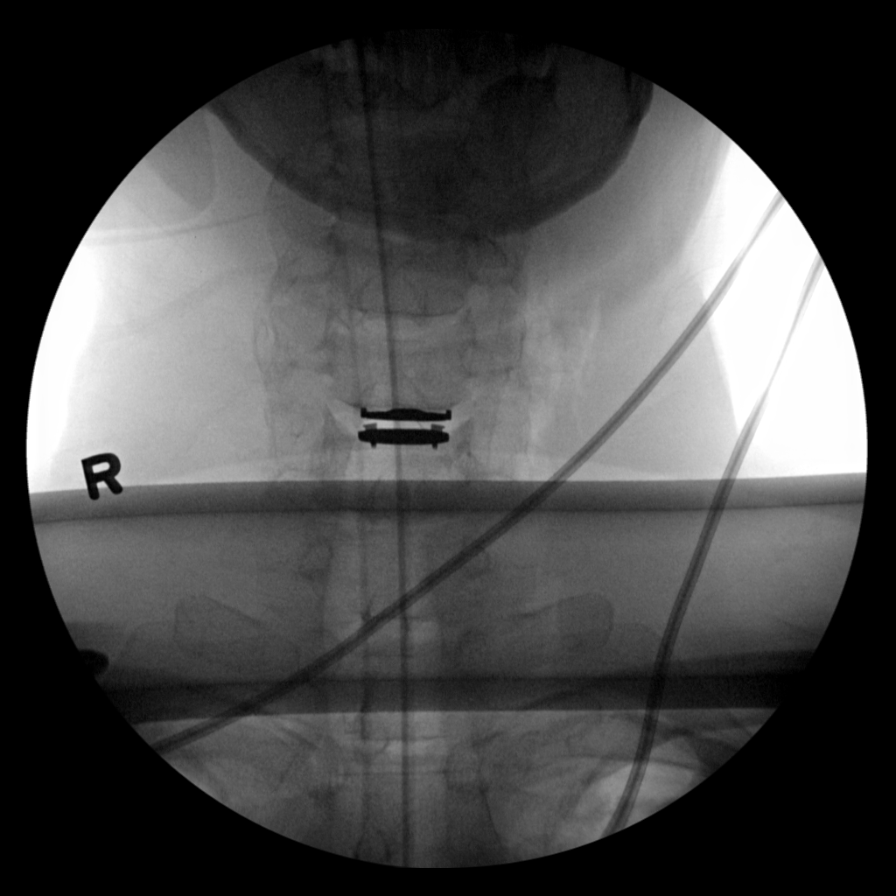

[6 of 6 positions shown; findings below may reference images not displayed]

FINDINGS: Six C-arm spot films were returned. The initial film show screws
placed in C5 and C6 with interbody metallic device in good position
at C5-6. Normal alignment is maintained. There appear to be C-arm
lateral views with slight flexion and slight extension showing good
position of the C5-6 arthroplasty device.
IMPRESSION: C5-6 disc arthroplasty in good position.

## 2019-05-25 DEATH — deceased
